# Patient Record
Sex: Male | Born: 1950 | Race: White | Hispanic: No | Marital: Married | Smoking: Never smoker
Health system: Southern US, Community
[De-identification: ages and names within clinical notes are randomized; demographics above are authoritative.]

## PROBLEM LIST (undated history)

## (undated) DIAGNOSIS — E559 Vitamin D deficiency, unspecified: Secondary | ICD-10-CM

## (undated) DIAGNOSIS — I480 Paroxysmal atrial fibrillation: Secondary | ICD-10-CM

## (undated) DIAGNOSIS — E291 Testicular hypofunction: Secondary | ICD-10-CM

## (undated) DIAGNOSIS — E039 Hypothyroidism, unspecified: Secondary | ICD-10-CM

## (undated) DIAGNOSIS — E669 Obesity, unspecified: Secondary | ICD-10-CM

## (undated) DIAGNOSIS — M7730 Calcaneal spur, unspecified foot: Secondary | ICD-10-CM

## (undated) DIAGNOSIS — G4733 Obstructive sleep apnea (adult) (pediatric): Secondary | ICD-10-CM

## (undated) DIAGNOSIS — G629 Polyneuropathy, unspecified: Secondary | ICD-10-CM

## (undated) DIAGNOSIS — E7800 Pure hypercholesterolemia, unspecified: Secondary | ICD-10-CM

## (undated) HISTORY — DX: Obstructive sleep apnea (adult) (pediatric): G47.33

## (undated) HISTORY — DX: Hypothyroidism, unspecified: E03.9

## (undated) HISTORY — DX: Obesity, unspecified: E66.9

## (undated) HISTORY — DX: Calcaneal spur, unspecified foot: M77.30

## (undated) HISTORY — PX: HEMORRHOID SURGERY: SHX153

## (undated) HISTORY — DX: Pure hypercholesterolemia, unspecified: E78.00

## (undated) HISTORY — PX: SEPTOPLASTY: SUR1290

## (undated) HISTORY — DX: Polyneuropathy, unspecified: G62.9

## (undated) HISTORY — DX: Testicular hypofunction: E29.1

## (undated) HISTORY — DX: Paroxysmal atrial fibrillation: I48.0

## (undated) HISTORY — DX: Vitamin D deficiency, unspecified: E55.9

## (undated) HISTORY — PX: TONSILLECTOMY: SUR1361

---

## 2018-09-27 NOTE — Progress Notes (Signed)
WEATHER FOR Ian Matthews

## 2018-09-28 ENCOUNTER — Ambulatory Visit (INDEPENDENT_AMBULATORY_CARE_PROVIDER_SITE_OTHER): Payer: Medicare Other | Admitting: Internal Medicine

## 2018-09-28 ENCOUNTER — Encounter: Payer: Self-pay | Admitting: Internal Medicine

## 2018-09-28 ENCOUNTER — Other Ambulatory Visit: Payer: Self-pay | Admitting: Internal Medicine

## 2018-09-28 ENCOUNTER — Encounter (INDEPENDENT_AMBULATORY_CARE_PROVIDER_SITE_OTHER): Payer: Self-pay

## 2018-09-28 VITALS — BP 110/78 | HR 54 | Ht 74.0 in | Wt 238.0 lb

## 2018-09-28 DIAGNOSIS — I480 Paroxysmal atrial fibrillation: Secondary | ICD-10-CM | POA: Diagnosis not present

## 2018-09-28 DIAGNOSIS — I4891 Unspecified atrial fibrillation: Secondary | ICD-10-CM

## 2018-09-28 DIAGNOSIS — G4733 Obstructive sleep apnea (adult) (pediatric): Secondary | ICD-10-CM

## 2018-09-28 MED ORDER — FLECAINIDE ACETATE 100 MG PO TABS: ORAL_TABLET | ORAL | 3 refills | Status: DC

## 2018-09-28 MED ORDER — METOPROLOL TARTRATE 25 MG PO TABS: 25.0000 mg | ORAL_TABLET | Freq: Four times a day (QID) | ORAL | 3 refills | Status: DC | PRN

## 2018-09-28 MED ORDER — APIXABAN 5 MG PO TABS: 5.0000 mg | ORAL_TABLET | Freq: Two times a day (BID) | ORAL | 11 refills | Status: DC

## 2018-09-28 NOTE — H&P (View-Only) (Signed)
Electrophysiology Office Note   Date:  09/28/2018   ID:  Ian Matthews, DOB [birth year], MRN [id number]  PCP:  Romeo Rabon, MD  Cardiologist:  Dr Phylliss Bob Primary Electrophysiologist: [name], MD    CC: afib   History of Present Illness: Ian Matthews is a 68 y.o. male who presents today for electrophysiology evaluation.   He is self referred for consideration of afib management. He reports having 5 episodes of afib over the past 5 years.  He has had 2 episodes over the past few months.  He is unaware of triggers or precipitants.  AF typically spontaneously terminates after about 3 days.  He has palpitations, fatigue, and dizziness with his afib.   Most recently, this past Thursday he went into afib.  He went to the ED on Sunday in [place] when his afib did not resolve.  He received IV metoprolol and converted to sinus rhythm.  He has done well since.  Today, he denies symptoms of palpitations, chest pain, shortness of breath, orthopnea, PND, lower extremity edema, claudication, dizziness, presyncope, syncope, bleeding, or neurologic sequela. The patient is tolerating medications without difficulties and is otherwise without complaint today.    Past Medical History:  Diagnosis Date  . Calcaneal spur   . Hypercholesterolemia   . Hypogonadism in male   . Hypothyroidism   . Neuropathy   . Obesity (BMI 35.0-39.9 without comorbidity)   . Obstructive sleep apnea    s/p septoplasty  . Vitamin D deficiency    Past Surgical History:  Procedure Laterality Date  . HEMORRHOID SURGERY    . SEPTOPLASTY    . TONSILLECTOMY       Current Outpatient Medications  Medication Sig Dispense Refill  . doxycycline (ORACEA) 40 MG capsule Take 40 mg by mouth every morning.    Marland Kitchen doxycycline (VIBRAMYCIN) 100 MG capsule Take 1 capsule by mouth as needed for rash.    . ergocalciferol (VITAMIN D2) 1.25 MG (50000 UT) capsule Take 50,000 Units by mouth once a week.    . levothyroxine  (SYNTHROID, LEVOTHROID) 100 MCG tablet Take 100 mcg by mouth daily before breakfast.    . metoprolol succinate (TOPROL-XL) 25 MG 24 hr tablet Take 1 tablet by mouth daily.    . rosuvastatin (CRESTOR) 5 MG tablet Take 5 mg by mouth every Monday, Wednesday, and Friday.     No current facility-administered medications for this visit.     Allergies:   Patient has no known allergies.   Social History:  The patient  reports that he has never smoked. He has never used smokeless tobacco. He reports previous alcohol use. He reports previous drug use.   Family History:  The patient's  family history includes Alzheimer's disease in his mother; Atrial fibrillation in his mother and sister; Cancer in his father; Diabetes in his father.    ROS:  Please see the history of present illness.   All other systems are personally reviewed and negative.    PHYSICAL EXAM: VS:  BP 110/78   Pulse (!) 54   Ht 6\' 2"  (1.88 m)   Wt 238 lb (108 kg)   SpO2 96%   BMI 30.56 kg/m  , BMI Body mass index is 30.56 kg/m. GEN: Well nourished, well developed, in no acute distress  HEENT: normal  Neck: no JVD, carotid bruits, or masses Cardiac: RRR; no murmurs, rubs, or gallops,no edema  Respiratory:  clear to auscultation bilaterally, normal work of breathing GI: soft, nontender, nondistended, + BS  MS: no deformity or atrophy  Skin: warm and dry  Neuro:  Strength and sensation are intact Psych: euthymic mood, full affect  EKG:  EKG is ordered today. The ekg ordered today is personally reviewed and shows sinus bradycardia   Lipid Panel  No results found for: CHOL, TRIG, HDL, CHOLHDL, VLDL, LDLCALC, LDLDIRECT personally reviewed   Wt Readings from Last 3 Encounters:  09/28/18 238 lb (108 kg)      Other studies personally reviewed: Additional studies/ records that were reviewed today include: records from Dr Zakhary's office  Review of the above records today demonstrates: prior echo reveals no structural  heart disease,  afib is documented on ekg   ASSESSMENT AND PLAN:  1.  afib The patient has symptomatic, recurrent paroyxsmal atrial fibrillation. he has failed medical therapy with metoprolol.  Given bradycardia, his AAD options are limited Chads2vasc score is 1.  Therapeutic strategies for afib including medicine (pill in pocket flecainide) and ablation were discussed in detail with the patient today. Risk, benefits, and alternatives to EP study and radiofrequency ablation for afib were also discussed in detail today. These risks include but are not limited to stroke, bleeding, vascular damage, tamponade, perforation, damage to the esophagus, lungs, and other structures, pulmonary vein stenosis, worsening renal function, and death. The patient understands these risk and wishes to proceed.  We will therefore proceed with catheter ablation at the next available time.  Carto, ICE, anesthesia are requested for the procedure.  Will also obtain cardiac CT prior to the procedure to exclude LAA thrombus and further evaluate atrial anatomy.  2. Overweight Body mass index is 30.56 kg/m. Lifestyle modification encouraged  3. OSA He thinks this is resolved s/p septoplasty I have encouraged that he have repeat sleep study with primary care   Current medicines are reviewed at length with the patient today.   The patient does not have concerns regarding his medicines.  The following changes were made today:  none  Labs/ tests ordered today include:  Orders Placed This Encounter  Procedures  . EKG 12-Lead     Signed, Gary Bultman, MD  09/28/2018 10:21 AM     [facility] [address] [place] [address] [phone] (office) [phone] (fax) 

## 2018-09-28 NOTE — Progress Notes (Signed)
Electrophysiology Office Note   Date:  09/28/2018   ID:  Ian Matthews, DOB [birth year], MRN [id number]  PCP:  Romeo Rabon, MD  Cardiologist:  Dr Phylliss Bob Primary Electrophysiologist: [name], MD    CC: afib   History of Present Illness: Ian Matthews is a 68 y.o. male who presents today for electrophysiology evaluation.   He is self referred for consideration of afib management. He reports having 5 episodes of afib over the past 5 years.  He has had 2 episodes over the past few months.  He is unaware of triggers or precipitants.  AF typically spontaneously terminates after about 3 days.  He has palpitations, fatigue, and dizziness with his afib.   Most recently, this past Thursday he went into afib.  He went to the ED on Sunday in [place] when his afib did not resolve.  He received IV metoprolol and converted to sinus rhythm.  He has done well since.  Today, he denies symptoms of palpitations, chest pain, shortness of breath, orthopnea, PND, lower extremity edema, claudication, dizziness, presyncope, syncope, bleeding, or neurologic sequela. The patient is tolerating medications without difficulties and is otherwise without complaint today.    Past Medical History:  Diagnosis Date  . Calcaneal spur   . Hypercholesterolemia   . Hypogonadism in male   . Hypothyroidism   . Neuropathy   . Obesity (BMI 35.0-39.9 without comorbidity)   . Obstructive sleep apnea    s/p septoplasty  . Vitamin D deficiency    Past Surgical History:  Procedure Laterality Date  . HEMORRHOID SURGERY    . SEPTOPLASTY    . TONSILLECTOMY       Current Outpatient Medications  Medication Sig Dispense Refill  . doxycycline (ORACEA) 40 MG capsule Take 40 mg by mouth every morning.    Marland Kitchen doxycycline (VIBRAMYCIN) 100 MG capsule Take 1 capsule by mouth as needed for rash.    . ergocalciferol (VITAMIN D2) 1.25 MG (50000 UT) capsule Take 50,000 Units by mouth once a week.    . levothyroxine  (SYNTHROID, LEVOTHROID) 100 MCG tablet Take 100 mcg by mouth daily before breakfast.    . metoprolol succinate (TOPROL-XL) 25 MG 24 hr tablet Take 1 tablet by mouth daily.    . rosuvastatin (CRESTOR) 5 MG tablet Take 5 mg by mouth every Monday, Wednesday, and Friday.     No current facility-administered medications for this visit.     Allergies:   Patient has no known allergies.   Social History:  The patient  reports that he has never smoked. He has never used smokeless tobacco. He reports previous alcohol use. He reports previous drug use.   Family History:  The patient's  family history includes Alzheimer's disease in his mother; Atrial fibrillation in his mother and sister; Cancer in his father; Diabetes in his father.    ROS:  Please see the history of present illness.   All other systems are personally reviewed and negative.    PHYSICAL EXAM: VS:  BP 110/78   Pulse (!) 54   Ht 6\' 2"  (1.88 m)   Wt 238 lb (108 kg)   SpO2 96%   BMI 30.56 kg/m  , BMI Body mass index is 30.56 kg/m. GEN: Well nourished, well developed, in no acute distress  HEENT: normal  Neck: no JVD, carotid bruits, or masses Cardiac: RRR; no murmurs, rubs, or gallops,no edema  Respiratory:  clear to auscultation bilaterally, normal work of breathing GI: soft, nontender, nondistended, + BS  MS: no deformity or atrophy  Skin: warm and dry  Neuro:  Strength and sensation are intact Psych: euthymic mood, full affect  EKG:  EKG is ordered today. The ekg ordered today is personally reviewed and shows sinus bradycardia   Lipid Panel  No results found for: CHOL, TRIG, HDL, CHOLHDL, VLDL, LDLCALC, LDLDIRECT personally reviewed   Wt Readings from Last 3 Encounters:  09/28/18 238 lb (108 kg)      Other studies personally reviewed: Additional studies/ records that were reviewed today include: records from Dr Oneita Jolly office  Review of the above records today demonstrates: prior echo reveals no structural  heart disease,  afib is documented on ekg   ASSESSMENT AND PLAN:  1.  afib The patient has symptomatic, recurrent paroyxsmal atrial fibrillation. he has failed medical therapy with metoprolol.  Given bradycardia, his AAD options are limited Chads2vasc score is 1.  Therapeutic strategies for afib including medicine (pill in pocket flecainide) and ablation were discussed in detail with the patient today. Risk, benefits, and alternatives to EP study and radiofrequency ablation for afib were also discussed in detail today. These risks include but are not limited to stroke, bleeding, vascular damage, tamponade, perforation, damage to the esophagus, lungs, and other structures, pulmonary vein stenosis, worsening renal function, and death. The patient understands these risk and wishes to proceed.  We will therefore proceed with catheter ablation at the next available time.  Carto, ICE, anesthesia are requested for the procedure.  Will also obtain cardiac CT prior to the procedure to exclude LAA thrombus and further evaluate atrial anatomy.  2. Overweight Body mass index is 30.56 kg/m. Lifestyle modification encouraged  3. OSA He thinks this is resolved s/p septoplasty I have encouraged that he have repeat sleep study with primary care   Current medicines are reviewed at length with the patient today.   The patient does not have concerns regarding his medicines.  The following changes were made today:  none  Labs/ tests ordered today include:  Orders Placed This Encounter  Procedures  . EKG 12-Lead     Signed, [name], MD  09/28/2018 10:21 AM     [facility] [address] [place] [address] [phone] (office) [phone] (fax)

## 2018-09-28 NOTE — Patient Instructions (Addendum)
Medication Instructions:  Your physician has recommended you make the following change in your medication:   1.  Start taking Eliquis 5 mg--Take one tablet by mouth twice a day.  2.  You are getting a prescription for Flecainide 100 mg tablets- Take 3 tablets daily by mouth as needed for breakthrough afib.  3.  You are getting a prescription for Metoprolol tartrate 25 mg-  Take 1-2 tablets by mouth every 6 hours as needed for breakthrough afib.   Labwork: You will not need lab work.  Your lab work has been scanned into [employer].  Testing/Procedures: Your physician has requested that you have cardiac CT. Cardiac computed tomography (CT) is a painless test that uses an x-ray machine to take clear, detailed pictures of your heart. For further information please visit [email or web address] Please follow instruction sheet as given.   Your physician has recommended that you have an ablation. Catheter ablation is a medical procedure used to treat some cardiac arrhythmias (irregular heartbeats). During catheter ablation, a long, thin, flexible tube is put into a blood vessel in your groin (upper thigh), or neck. This tube is called an ablation catheter. It is then guided to your heart through the blood vessel. Radio frequency waves destroy small areas of heart tissue where abnormal heartbeats may cause an arrhythmia to start. Please see the instruction sheet given to you today.   Follow-Up: You will follow up with Rudi Coco, NP with the Atrial fibrillation (Afib) clinic 4 weeks after your ablation.  You will follow up with Dr. Johney Frame 3 months after your procedure.    CARDIAC CT INSTRUCTIONS:  Please arrive at the [facility] main entrance of [facility] at 30-45 minutes prior to test start time  [facility] [address] [place], [place] [address] [phone]  Proceed to the [facility] Radiology Department (First Floor).  Please follow these instructions carefully  (unless otherwise directed):  Hold all erectile dysfunction medications at least 48 hours prior to test.  On the Night Before the Test: . Be sure to Drink plenty of water. . Do not consume any caffeinated/decaffeinated beverages or chocolate 12 hours prior to your test. . Do not take any antihistamines 12 hours prior to your test.  On the Day of the Test: . Drink plenty of water. Do not drink any water within one hour of the test. . Do not eat any food 4 hours prior to the test. . You may take your regular medications prior to the test.         -Take metoprolol (Lopressor) 2 hours prior to test (if applicable).                  -If HR is less than 55 BPM- No Beta Blocker                -IF HR is greater than 55 BPM and patient is less than or equal to 20 yrs old Lopressor 100mg  x1.                   After the Test: . Drink plenty of water. . After receiving IV contrast, you may experience a mild flushed feeling. This is normal. . On occasion, you may experience a mild rash up to 24 hours after the test. This is not dangerous. If this occurs, you can take Benadryl 25 mg and increase your fluid intake. . If you experience trouble breathing, this can be serious. If it is  severe call 911 IMMEDIATELY. If it is mild, please call our office.    ABLATION INSTRUCTIONS:  Please arrive to ADMITTING down the hall [facility] of [facility] at:  5:30 am on October 17, 2018  Do not eat or drink after midnight prior to procedure On the morning of your procedure do not take any medications. Plan for one night stay.   You will need someone to drive you home at discharge.     Cardiac Ablation Cardiac ablation is a procedure to disable (ablate) a small amount of heart tissue in very specific places. The heart has many electrical connections. Sometimes these connections are abnormal and can cause the heart to beat very fast or irregularly. Ablating some of the problem  areas can improve the heart rhythm or return it to normal. Ablation may be done for people who:  Have Wolff-Parkinson-White syndrome.  Have fast heart rhythms (tachycardia).  Have taken medicines for an abnormal heart rhythm (arrhythmia) that were not effective or caused side effects.  Have a high-risk heartbeat that may be life-threatening. During the procedure, a small incision is made in the neck or the groin, and a long, thin, flexible tube (catheter) is inserted into the incision and moved to the heart. Small devices (electrodes) on the tip of the catheter will send out electrical currents. A type of X-ray (fluoroscopy) will be used to help guide the catheter and to provide images of the heart. Tell a health care provider about:  Any allergies you have.  All medicines you are taking, including vitamins, herbs, eye drops, creams, and over-the-counter medicines.  Any problems you or family members have had with anesthetic medicines.  Any blood disorders you have.  Any surgeries you have had.  Any medical conditions you have, such as kidney failure.  Whether you are pregnant or may be pregnant. What are the risks? Generally, this is a safe procedure. However, problems may occur, including:  Infection.  Bruising and bleeding at the catheter insertion site.  Bleeding into the chest, especially into the sac that surrounds the heart. This is a serious complication.  Stroke or blood clots.  Damage to other structures or organs.  Allergic reaction to medicines or dyes.  Need for a permanent pacemaker if the normal electrical system is damaged. A pacemaker is a small computer that sends electrical signals to the heart and helps your heart beat normally.  The procedure not being fully effective. This may not be recognized until months later. Repeat ablation procedures are sometimes required. What happens before the procedure?  Follow instructions from your health care provider  about eating or drinking restrictions.  Ask your health care provider about: ? Changing or stopping your regular medicines. This is especially important if you are taking diabetes medicines or blood thinners. ? Taking medicines such as aspirin and ibuprofen. These medicines can thin your blood. Do not take these medicines before your procedure if your health care provider instructs you not to.  Plan to have someone take you home from the hospital or clinic.  If you will be going home right after the procedure, plan to have someone with you for 24 hours. What happens during the procedure?  To lower your risk of infection: ? Your health care team will wash or sanitize their hands. ? Your skin will be washed with soap. ? Hair may be removed from the incision area.  An IV tube will be inserted into one of your  veins.  You will be given a medicine to help you relax (sedative).  The skin on your neck or groin will be numbed.  An incision will be made in your neck or your groin.  A needle will be inserted through the incision and into a large vein in your neck or groin.  A catheter will be inserted into the needle and moved to your heart.  Dye may be injected through the catheter to help your surgeon see the area of the heart that needs treatment.  Electrical currents will be sent from the catheter to ablate heart tissue in desired areas. There are three types of energy that may be used to ablate heart tissue: ? Heat (radiofrequency energy). ? Laser energy. ? Extreme cold (cryoablation).  When the necessary tissue has been ablated, the catheter will be removed.  Pressure will be held on the catheter insertion area to prevent excessive bleeding.  A bandage (dressing) will be placed over the catheter insertion area. The procedure may vary among health care providers and hospitals. What happens after the procedure?  Your blood pressure, heart rate, breathing rate, and blood oxygen  level will be monitored until the medicines you were given have worn off.  Your catheter insertion area will be monitored for bleeding. You will need to lie still for a few hours to ensure that you do not bleed from the catheter insertion area.  Do not drive for 24 hours or as long as directed by your health care provider. Summary  Cardiac ablation is a procedure to disable (ablate) a small amount of heart tissue in very specific places. Ablating some of the problem areas can improve the heart rhythm or return it to normal.  During the procedure, electrical currents will be sent from the catheter to ablate heart tissue in desired areas. This information is not intended to replace advice given to you by your health care provider. Make sure you discuss any questions you have with your health care provider. Document Released: 12/12/2008 Document Revised: 06/14/2016 Document Reviewed: 06/14/2016 Elsevier Interactive Patient Education  2019 [employer].

## 2018-10-09 ENCOUNTER — Telehealth (HOSPITAL_COMMUNITY): Payer: Self-pay | Admitting: Emergency Medicine

## 2018-10-09 NOTE — Telephone Encounter (Signed)
Reaching out to patient to offer assistance regarding upcoming cardiac imaging study; pt verbalizes understanding of appt date/time, parking situation and where to check in, pre-test NPO status and medications ordered, and verified current allergies; name and call back number provided for further questions should they arise Demorio Seeley RN Navigator Cardiac Imaging [facility] [phone] office [phone] cell 

## 2018-10-10 ENCOUNTER — Encounter (HOSPITAL_COMMUNITY): Payer: Self-pay

## 2018-10-10 ENCOUNTER — Ambulatory Visit (HOSPITAL_COMMUNITY): Payer: Medicare Other

## 2018-10-10 ENCOUNTER — Ambulatory Visit (HOSPITAL_COMMUNITY)
Admission: RE | Admit: 2018-10-10 | Discharge: 2018-10-10 | Disposition: A | Discharge status: Home / Self Care | Payer: Medicare Other | Source: Ambulatory Visit | Attending: Internal Medicine | Admitting: Internal Medicine

## 2018-10-10 DIAGNOSIS — I4891 Unspecified atrial fibrillation: Secondary | ICD-10-CM | POA: Diagnosis not present

## 2018-10-10 MED ORDER — IOHEXOL 350 MG/ML SOLN
80.0000 mL | Freq: Once | INTRAVENOUS | Status: AC | PRN
  Administered 2018-10-10: 80 mL via INTRAVENOUS

## 2018-10-10 NOTE — Progress Notes (Signed)
Pt tolerated exam without incident.  PIV removed, was bleeding, applied manual pressure x3 min until bleeding stooped and then applied gauze dressing.  Provided instructions to patient on what to do if the puncture site were to bleed again, pt stated he understood.  Pt discharged

## 2018-10-10 NOTE — Discharge Instructions (Signed)
What You Need to Know About IV Contrast Material °IV contrast material is most often a fluid that is used with some imaging tests. Contrast material is injected into your body through a vein to help your health care providers see your organs and tissues more clearly. It may be used with: °· X-ray. °· MRI. °· CT. °· Ultrasound. °Contrast material is used when your health care providers need a detailed look at organs, tissues, or blood vessels that may not show up with the standard test. IV contrast may be used for imaging tests that examine: °· Muscles, skin, and fat. °· Breasts. °· Brain. °· Digestive tract. °· Heart. °· Liver. °· Lungs and many other internal organs. °What are the risks of using IV contrast material? °The risks of using IV contrast material include: °· Headache. °· Itching, skin rash, and hives. °· Allergic reactions. °· Nausea and vomiting. °· Wheezing or difficulty breathing. °· Abnormal heart rate. °· Blood pressure changes. °· Throat swelling. °· Kidney damage. °These complications are more likely to occur in people who: °· Have kidney failure. °· Have liver problems. °· Have certain heart problems, including: °? Heart failure. °? Heart attack. °? Heart infection. °? Heart valve problems. °· Abuse alcohol. °· Have allergies or asthma. °· Are dehydrated. °· Have sickle cell anemia or similar problems. °· Have had trouble with IV contrast material in the past. °· Take certain medicines, such as: °? Metformin. °? NSAIDs. °? Beta blockers. °? Interleukin-2. °How do I prepare for my test with IV contrast material? °· Follow instructions from your health care provider about eating or drinking restrictions. °· Ask your health care provider about changing or stopping your regular medicines. This is especially important if you are taking diabetes medicines or blood thinners. °· Tell your health care provider about: °? Any previous illnesses, surgeries, or pre-existing medical conditions. °? Whether you  are pregnant or may be pregnant. °? Whether you are breastfeeding. Most contrast agents are safe for use in breastfeeding women. °· You may have a physical exam to determine any potential risks. °· Ask if you will be given a medicine (sedative) to help you relax during the procedure. If so, plan to have someone take you home after test. °What happens during the test with IV contrast material? ° °· You may be given a sedative to help you relax. °· A needle will be inserted into one of your veins to administer the IV contrast material. °· You may feel warmth or flushing as the material enters your bloodstream. °· You may have a metallic taste in your mouth for a few minutes. °· The needle may cause some discomfort and bruising. °· After the contrast material is in your body, the imaging test will be done. °The procedure may vary among health care providers and hospitals. °What happens after the test with IV contrast material? °· You may be asked to drink water or other fluids to wash (flush) the contrast material out of your body. °· Drink enough fluid to keep your urine pale yellow. °· Do not drive for 24 hours if you received a sedative. °· It is your responsibility to get your test results. Ask your health care provider or the department performing the test when your results will be ready. °Contact a health care provider if: °· You have redness, swelling, or pain near your IV site. °Get help right away if: °· You have an abnormal heart rhythm. °· You have trouble breathing. °· You have: °?   Chest pain. °? Pain in your back, neck, arm, jaw, or stomach. °? Nausea or sweating. °? Hives or a rash. °· You start shaking and cannot stop. °These symptoms may represent a serious problem that is an emergency. Do not wait to see if the symptoms will go away. Get medical help right away. Call your local emergency services (911 in the [place] Do not drive yourself to the hospital. °Summary °· IV contrast may be used for imaging  tests to help your health care providers see your organs and tissues more clearly. °· Tell your health care provider if you are pregnant or may be pregnant. °· During the procedure, you may feel warmth or flushing as the material enters your bloodstream. °· After the procedure, drink enough fluid to keep your urine pale yellow. °This information is not intended to replace advice given to you by your health care provider. Make sure you discuss any questions you have with your health care provider. °Document Released: 07/14/2009 Document Revised: 03/20/2018 Document Reviewed: 04/02/2015 °Elsevier Interactive Patient Education © 2019 [employer] ° °

## 2018-10-16 NOTE — Anesthesia Preprocedure Evaluation (Addendum)
Anesthesia Evaluation  Patient identified by MRN, date of birth, ID band Patient awake    Reviewed: Allergy & Precautions, NPO status , Patient's Chart, lab work & pertinent test results  Airway Mallampati: II  TM Distance: >3 FB Neck ROM: Full    Dental  (+) Teeth Intact, Dental Advisory Given, Chipped,    Pulmonary sleep apnea ,    Pulmonary exam normal breath sounds clear to auscultation       Cardiovascular Normal cardiovascular exam+ dysrhythmias Atrial Fibrillation  Rhythm:Regular Rate:Normal     Neuro/Psych negative neurological ROS     GI/Hepatic negative GI ROS, Neg liver ROS,   Endo/Other  Hypothyroidism Obesity   Renal/GU negative Renal ROS     Musculoskeletal negative musculoskeletal ROS (+)   Abdominal   Peds  Hematology  (+) Blood dyscrasia (Eliquis), ,   Anesthesia Other Findings Day of surgery medications reviewed with the patient.  Reproductive/Obstetrics                            Anesthesia Physical Anesthesia Plan  ASA: III  Anesthesia Plan: General   Post-op Pain Management:    Induction: Intravenous  PONV Risk Score and Plan: 2 and Ondansetron, Dexamethasone and Midazolam  Airway Management Planned: Oral ETT  Additional Equipment:   Intra-op Plan:   Post-operative Plan: Extubation in OR  Informed Consent: I have reviewed the patients History and Physical, chart, labs and discussed the procedure including the risks, benefits and alternatives for the proposed anesthesia with the patient or authorized representative who has indicated his/her understanding and acceptance.     Dental advisory given  Plan Discussed with: CRNA  Anesthesia Plan Comments:        Anesthesia Quick Evaluation

## 2018-10-17 ENCOUNTER — Ambulatory Visit (HOSPITAL_COMMUNITY): Payer: Medicare Other | Admitting: Anesthesiology

## 2018-10-17 ENCOUNTER — Other Ambulatory Visit: Payer: Self-pay

## 2018-10-17 ENCOUNTER — Encounter (HOSPITAL_COMMUNITY): Payer: Self-pay | Admitting: Internal Medicine

## 2018-10-17 ENCOUNTER — Encounter (HOSPITAL_COMMUNITY)
Admission: RE | Disposition: A | Discharge status: Home / Self Care | Payer: Self-pay | Source: Home / Self Care | Attending: Internal Medicine

## 2018-10-17 ENCOUNTER — Ambulatory Visit (HOSPITAL_COMMUNITY)
Admission: RE | Admit: 2018-10-17 | Discharge: 2018-10-18 | Disposition: A | Discharge status: Home / Self Care | Payer: Medicare Other | Attending: Internal Medicine | Admitting: Internal Medicine

## 2018-10-17 DIAGNOSIS — Z79899 Other long term (current) drug therapy: Secondary | ICD-10-CM | POA: Insufficient documentation

## 2018-10-17 DIAGNOSIS — E669 Obesity, unspecified: Secondary | ICD-10-CM | POA: Diagnosis not present

## 2018-10-17 DIAGNOSIS — Z7901 Long term (current) use of anticoagulants: Secondary | ICD-10-CM | POA: Diagnosis not present

## 2018-10-17 DIAGNOSIS — Z8249 Family history of ischemic heart disease and other diseases of the circulatory system: Secondary | ICD-10-CM | POA: Diagnosis not present

## 2018-10-17 DIAGNOSIS — Z6830 Body mass index (BMI) 30.0-30.9, adult: Secondary | ICD-10-CM | POA: Insufficient documentation

## 2018-10-17 DIAGNOSIS — Z79890 Hormone replacement therapy (postmenopausal): Secondary | ICD-10-CM | POA: Diagnosis not present

## 2018-10-17 DIAGNOSIS — E559 Vitamin D deficiency, unspecified: Secondary | ICD-10-CM | POA: Diagnosis not present

## 2018-10-17 DIAGNOSIS — G629 Polyneuropathy, unspecified: Secondary | ICD-10-CM | POA: Insufficient documentation

## 2018-10-17 DIAGNOSIS — E039 Hypothyroidism, unspecified: Secondary | ICD-10-CM | POA: Insufficient documentation

## 2018-10-17 DIAGNOSIS — E785 Hyperlipidemia, unspecified: Secondary | ICD-10-CM | POA: Insufficient documentation

## 2018-10-17 DIAGNOSIS — G4733 Obstructive sleep apnea (adult) (pediatric): Secondary | ICD-10-CM | POA: Diagnosis not present

## 2018-10-17 DIAGNOSIS — I480 Paroxysmal atrial fibrillation: Secondary | ICD-10-CM | POA: Diagnosis not present

## 2018-10-17 HISTORY — PX: ATRIAL FIBRILLATION ABLATION: EP1191

## 2018-10-17 LAB — POCT ACTIVATED CLOTTING TIME
Activated Clotting Time: 257 seconds
Activated Clotting Time: 301 seconds
Activated Clotting Time: 230 seconds
Activated Clotting Time: 197 seconds

## 2018-10-17 SURGERY — ATRIAL FIBRILLATION ABLATION
Anesthesia: General

## 2018-10-17 MED ORDER — APIXABAN 5 MG PO TABS
5.0000 mg | ORAL_TABLET | Freq: Two times a day (BID) | ORAL | Status: DC
  Administered 2018-10-17 – 2018-10-18 (×3): 5 mg via ORAL
  Filled 2018-10-17 (×2): qty 1

## 2018-10-17 MED ORDER — SODIUM CHLORIDE 0.9 % IV SOLN: 250.0000 mL | INTRAVENOUS | Status: DC | PRN

## 2018-10-17 MED ORDER — SODIUM CHLORIDE 0.9 % IV SOLN
INTRAVENOUS | Status: DC | PRN
  Administered 2018-10-17: 50 ug/min via INTRAVENOUS

## 2018-10-17 MED ORDER — OFF THE BEAT BOOK
Freq: Once | Status: AC
  Administered 2018-10-17: 1

## 2018-10-17 MED ORDER — HEPARIN SODIUM (PORCINE) 1000 UNIT/ML IJ SOLN
INTRAMUSCULAR | Status: AC
  Filled 2018-10-17: qty 1

## 2018-10-17 MED ORDER — ACETAMINOPHEN 325 MG PO TABS: 650.0000 mg | ORAL_TABLET | ORAL | Status: DC | PRN

## 2018-10-17 MED ORDER — PROPOFOL 10 MG/ML IV BOLUS
INTRAVENOUS | Status: DC | PRN
  Administered 2018-10-17: 150 mg via INTRAVENOUS

## 2018-10-17 MED ORDER — SODIUM CHLORIDE 0.9% FLUSH
3.0000 mL | Freq: Two times a day (BID) | INTRAVENOUS | Status: DC
  Administered 2018-10-17 – 2018-10-18 (×2): 3 mL via INTRAVENOUS

## 2018-10-17 MED ORDER — GLYCOPYRROLATE PF 0.2 MG/ML IJ SOSY
PREFILLED_SYRINGE | INTRAMUSCULAR | Status: DC | PRN
  Administered 2018-10-17: .2 mg via INTRAVENOUS

## 2018-10-17 MED ORDER — ROCURONIUM BROMIDE 50 MG/5ML IV SOSY
PREFILLED_SYRINGE | INTRAVENOUS | Status: DC | PRN
  Administered 2018-10-17: 10 mg via INTRAVENOUS
  Administered 2018-10-17: 60 mg via INTRAVENOUS

## 2018-10-17 MED ORDER — ISOPROTERENOL HCL 0.2 MG/ML IJ SOLN
INTRAMUSCULAR | Status: AC
  Filled 2018-10-17: qty 5

## 2018-10-17 MED ORDER — MIDAZOLAM HCL 5 MG/5ML IJ SOLN
INTRAMUSCULAR | Status: DC | PRN
  Administered 2018-10-17 (×2): 1 mg via INTRAVENOUS

## 2018-10-17 MED ORDER — BUPIVACAINE HCL (PF) 0.25 % IJ SOLN
INTRAMUSCULAR | Status: DC | PRN
  Administered 2018-10-17: 30 mL

## 2018-10-17 MED ORDER — SODIUM CHLORIDE 0.9 % IV SOLN
INTRAVENOUS | Status: DC
  Administered 2018-10-17: 06:00:00 via INTRAVENOUS

## 2018-10-17 MED ORDER — HEPARIN SODIUM (PORCINE) 1000 UNIT/ML IJ SOLN
INTRAMUSCULAR | Status: DC | PRN
  Administered 2018-10-17: 5000 [IU] via INTRAVENOUS
  Administered 2018-10-17: 4000 [IU] via INTRAVENOUS

## 2018-10-17 MED ORDER — METOPROLOL SUCCINATE ER 25 MG PO TB24
25.0000 mg | ORAL_TABLET | Freq: Every day | ORAL | Status: DC
  Administered 2018-10-17: 25 mg via ORAL
  Filled 2018-10-17: qty 1

## 2018-10-17 MED ORDER — HEPARIN SODIUM (PORCINE) 1000 UNIT/ML IJ SOLN
INTRAMUSCULAR | Status: DC | PRN
  Administered 2018-10-17: 1000 [IU] via INTRAVENOUS
  Administered 2018-10-17: 12000 [IU] via INTRAVENOUS

## 2018-10-17 MED ORDER — ONDANSETRON HCL 4 MG/2ML IJ SOLN: 4.0000 mg | Freq: Four times a day (QID) | INTRAMUSCULAR | Status: DC | PRN

## 2018-10-17 MED ORDER — PROTAMINE SULFATE 10 MG/ML IV SOLN
INTRAVENOUS | Status: DC | PRN
  Administered 2018-10-17: 20 mg via INTRAVENOUS
  Administered 2018-10-17 (×2): 10 mg via INTRAVENOUS

## 2018-10-17 MED ORDER — SODIUM CHLORIDE 0.9% FLUSH: 3.0000 mL | INTRAVENOUS | Status: DC | PRN

## 2018-10-17 MED ORDER — DEXAMETHASONE SODIUM PHOSPHATE 10 MG/ML IJ SOLN
INTRAMUSCULAR | Status: DC | PRN
  Administered 2018-10-17: 10 mg via INTRAVENOUS

## 2018-10-17 MED ORDER — HEPARIN (PORCINE) IN NACL 1000-0.9 UT/500ML-% IV SOLN
INTRAVENOUS | Status: DC | PRN
  Administered 2018-10-17: 500 mL

## 2018-10-17 MED ORDER — ONDANSETRON HCL 4 MG/2ML IJ SOLN
INTRAMUSCULAR | Status: DC | PRN
  Administered 2018-10-17: 4 mg via INTRAVENOUS

## 2018-10-17 MED ORDER — FENTANYL CITRATE (PF) 100 MCG/2ML IJ SOLN
INTRAMUSCULAR | Status: DC | PRN
  Administered 2018-10-17: 50 ug via INTRAVENOUS
  Administered 2018-10-17 (×2): 25 ug via INTRAVENOUS

## 2018-10-17 MED ORDER — EPHEDRINE SULFATE 50 MG/ML IJ SOLN
INTRAMUSCULAR | Status: DC | PRN
  Administered 2018-10-17: 10 mg via INTRAVENOUS

## 2018-10-17 MED ORDER — LEVOTHYROXINE SODIUM 100 MCG PO TABS
100.0000 ug | ORAL_TABLET | Freq: Every day | ORAL | Status: DC
  Administered 2018-10-17: 100 ug via ORAL
  Filled 2018-10-17: qty 1

## 2018-10-17 MED ORDER — ACETAMINOPHEN 500 MG PO TABS
1000.0000 mg | ORAL_TABLET | Freq: Once | ORAL | Status: AC
  Administered 2018-10-17: 1000 mg via ORAL
  Filled 2018-10-17: qty 2

## 2018-10-17 MED ORDER — LIDOCAINE 2% (20 MG/ML) 5 ML SYRINGE
INTRAMUSCULAR | Status: DC | PRN
  Administered 2018-10-17: 60 mg via INTRAVENOUS

## 2018-10-17 MED ORDER — HEPARIN (PORCINE) IN NACL 1000-0.9 UT/500ML-% IV SOLN
INTRAVENOUS | Status: AC
  Filled 2018-10-17: qty 500

## 2018-10-17 MED ORDER — BUPIVACAINE HCL (PF) 0.25 % IJ SOLN
INTRAMUSCULAR | Status: AC
  Filled 2018-10-17: qty 30

## 2018-10-17 MED ORDER — HYDROCODONE-ACETAMINOPHEN 5-325 MG PO TABS: 1.0000 | ORAL_TABLET | ORAL | Status: DC | PRN

## 2018-10-17 MED ORDER — LACTATED RINGERS IV SOLN
INTRAVENOUS | Status: DC | PRN
  Administered 2018-10-17: 07:00:00 via INTRAVENOUS

## 2018-10-17 MED ORDER — ISOPROTERENOL HCL 0.2 MG/ML IJ SOLN
INTRAVENOUS | Status: DC | PRN
  Administered 2018-10-17: 20 ug/min via INTRAVENOUS

## 2018-10-17 SURGICAL SUPPLY — 17 items
BLANKET WARM UNDERBOD FULL ACC (MISCELLANEOUS) ×3 IMPLANT
CATH MAPPNG PENTARAY F 2-6-2MM (CATHETERS) ×1 IMPLANT
CATH NAVISTAR SMARTTOUCH DF (ABLATOR) ×3 IMPLANT
CATH SOUNDSTAR ECO REPROCESSED (CATHETERS) ×3 IMPLANT
CATH WEBSTER BI DIR CS D-F CRV (CATHETERS) ×3 IMPLANT
COVER SWIFTLINK CONNECTOR (BAG) ×3 IMPLANT
NEEDLE BAYLIS TRANSSEPTAL 71CM (NEEDLE) ×3 IMPLANT
PACK EP LATEX FREE (CUSTOM PROCEDURE TRAY) ×2
PACK EP LF (CUSTOM PROCEDURE TRAY) ×1 IMPLANT
PAD PRO RADIOLUCENT 2001M-C (PAD) ×3 IMPLANT
PATCH CARTO3 (PAD) ×3 IMPLANT
PENTARAY F 2-6-2MM (CATHETERS) ×3
SHEATH AVANTI 11F 11CM (SHEATH) ×3 IMPLANT
SHEATH PINNACLE 7F 10CM (SHEATH) ×6 IMPLANT
SHEATH PINNACLE 9F 10CM (SHEATH) ×3 IMPLANT
SHEATH SWARTZ TS SL2 63CM 8.5F (SHEATH) ×3 IMPLANT
TUBING SMART ABLATE COOLFLOW (TUBING) ×3 IMPLANT

## 2018-10-17 NOTE — Progress Notes (Signed)
Site area: RFV x 3 Site Prior to Removal:  Level 0 Pressure Applied For: 30 min Manual:   yes Patient Status During Pull:  stable Post Pull Site:  Level 0 Post Pull Instructions Given:  palpable Post Pull Pulses Present:  Dressing Applied:  clear Bedrest begins @ 1300 till 1900 Comments:

## 2018-10-17 NOTE — Anesthesia Procedure Notes (Signed)
Procedure Name: Intubation Date/Time: 10/17/2018 7:47 AM Performed by: Lavell Luster, CRNA Pre-anesthesia Checklist: Patient identified, Emergency Drugs available, Suction available, Patient being monitored and Timeout performed Patient Re-evaluated:Patient Re-evaluated prior to induction Oxygen Delivery Method: Circle system utilized Preoxygenation: Pre-oxygenation with 100% oxygen Induction Type: IV induction Ventilation: Mask ventilation without difficulty Laryngoscope Size: Mac and 4 Grade View: Grade I Tube type: Oral Tube size: 7.5 mm Number of attempts: 1 Airway Equipment and Method: Stylet Placement Confirmation: ETT inserted through vocal cords under direct vision,  positive ETCO2 and breath sounds checked- equal and bilateral Secured at: 22 cm Tube secured with: Tape Dental Injury: Teeth and Oropharynx as per pre-operative assessment

## 2018-10-17 NOTE — Discharge Summary (Addendum)
ELECTROPHYSIOLOGY PROCEDURE DISCHARGE SUMMARY    Patient ID: Ian Matthews,  MRN: [id number], DOB/AGE: 69-01-1951 69 y.o.  Admit date: 10/17/2018 Discharge date: 10/18/2018  Primary Care Physician: Romeo Rabon, MD  Primary Cardiologist: Dr. Phylliss Bob Electrophysiologist: [name], MD  Primary Discharge Diagnosis:  1. Paroxysmal AFib     CHA2DS2Vasc is one, on Eliquis  Secondary Discharge Diagnosis:  1. HLD 2. Hypothyroidism 3. OSA     H/o septoplasty 4. Obesity  Procedures This Admission:  1.  Electrophysiology study and radiofrequency catheter ablation on 10/17/2018 by Dr [name].  This study demonstrated  CONCLUSIONS: 1. Sinus rhythm upon presentation.   2. Intracardiac echo reveals a moderate sized left atrium with four separate pulmonary veins without evidence of pulmonary vein stenosis. 3. Successful electrical isolation and anatomical encircling of all four pulmonary veins with radiofrequency current. 4. No inducible arrhythmias following ablation both on and off of Isuprel 5. No early apparent complications     Brief HPI: Ian Matthews is a 68 y.o. male with a history of symptomatic paroxysmal atrial fibrillation.   Risks, benefits, and alternatives to catheter ablation of atrial fibrillation were reviewed with the patient who wished to proceed.  The patient underwent cardiac CT prior to the procedure which demonstrated normal LV function and no LAA thrombus.    Hospital Course:  The patient was admitted and underwent EPS/RFCA of atrial fibrillation with details as outlined above.  The patient was monitored on telemetry overnight which demonstrated SB/SR 50's-60's.  R groin was without complication on the day of discharge.  The patient feels well this morning, no CP or SOB, no procedure site discomfort.  He was examined by Dr.  Johney Frame and considered to be stable for discharge.  Wound care and restrictions were reviewed with the patient.  The patient will be  seen back by the AFib clinic in 4 weeks and Dr Johney Frame in 12 weeks for post ablation follow up.      Physical Exam: Vitals:   10/17/18 2058 10/17/18 2100 10/18/18 0343 10/18/18 0735  BP: 136/87 136/87 122/76 113/83  Pulse: 70 90 60 66  Resp: 19 (!) 21 17 18   Temp:   97.9 F (36.6 C) 98.1 F (36.7 C)  TempSrc: Oral  Oral Oral  SpO2: 98% 96% 100% 98%  Weight:   105 kg   Height:        GEN- The patient is well appearing, alert and oriented x 3 today.   HEENT: normocephalic, atraumatic; sclera clear, conjunctiva pink; hearing intact; oropharynx clear; neck supple  Lungs- CTA b/l, normal work of breathing.  No wheezes, rales, rhonchi Heart-  RRR, no murmurs, rubs or gallops  GI- soft, non-tender, non-distended, bowel sounds present  Extremities- no clubbing, cyanosis, or edema; DP/PT pulses 2+ bilaterally, R groin without hematoma/bruit MS- no significant deformity or atrophy Skin- warm and dry, no rash or lesion Psych- euthymic mood, full affect Neuro- strength and sensation are intact   Labs:  No results found for: WBC, HGB, HCT, MCV, PLT No results for input(s): NA, K, CL, CO2, BUN, CREATININE, CALCIUM, PROT, BILITOT, ALKPHOS, ALT, AST, GLUCOSE in the last 168 hours.  Invalid input(s): LABALBU   Discharge Medications:  Allergies as of 10/18/2018   No Known Allergies     Medication List    TAKE these medications   apixaban 5 MG Tabs tablet Commonly known as:  Eliquis Take 1 tablet (5 mg total) by mouth 2 (two) times daily.   aspirin-acetaminophen-caffeine  250-250-65 MG tablet Commonly known as:  EXCEDRIN MIGRAINE Take 2 tablets by mouth daily as needed for headache.   doxycycline 100 MG capsule Commonly known as:  VIBRAMYCIN Take 100 mg by mouth 2 (two) times daily as needed (rosacea).   ergocalciferol 1.25 MG (50000 UT) capsule Commonly known as:  VITAMIN D2 Take 50,000 Units by mouth every Thursday.   flecainide 100 MG tablet Commonly known as:   TAMBOCOR Take 3 tablets as needed by mouth for breakthrough afib daily What changed:    how much to take  how to take this  when to take this  reasons to take this  additional instructions   levothyroxine 100 MCG tablet Commonly known as:  SYNTHROID, LEVOTHROID Take 100 mcg by mouth at bedtime.   metoprolol succinate 25 MG 24 hr tablet Commonly known as:  TOPROL-XL Take 25 mg by mouth at bedtime.   metoprolol tartrate 25 MG tablet Commonly known as:  LOPRESSOR Take 1-2 tablets (25-50 mg total) by mouth every 6 (six) hours as needed (for breakthrough afib).   pantoprazole 40 MG tablet Commonly known as:  Protonix Take 1 tablet (40 mg total) by mouth daily.   rosuvastatin 5 MG tablet Commonly known as:  CRESTOR Take 5 mg by mouth at bedtime.       Disposition:   Follow-up Information    [facility] Follow up.   Specialty:  Cardiology Why:  11/14/2018 @ 10:30AM Contact information: [address] [phone] mc [address] [place] [address] [phone]       [name], MD Follow up.   Specialty:  Cardiology Why:  01/29/2019 @ 10:30AM Contact information: [address] [place] [address] [phone]           Duration of Discharge Encounter: Greater than 30 minutes including physician time.  Signed, Francis Dowse, PA-C 10/18/2018 11:04 AM   I have seen, examined the patient, and reviewed the above assessment and plan.  Changes to above are made where necessary.  On exam, RRR.  Doing well s/p ablation.  DC to home with routine wound care and follow-up..  Co Sign: [name], MD 10/18/2018 10:05 PM

## 2018-10-17 NOTE — Interval H&P Note (Signed)
History and Physical Interval Note:  10/17/2018 7:37 AM  Ian Matthews  has presented today for surgery, with the diagnosis of atrial fibrillation.  The various methods of treatment have been discussed with the patient and family. After consideration of risks, benefits and other options for treatment, the patient has consented to  Procedure(s): ATRIAL FIBRILLATION ABLATION (N/A) as a surgical intervention.  The patient's history has been reviewed, patient examined, no change in status, stable for surgery.  I have reviewed the patient's chart and labs.  Questions were answered to the patient's satisfaction.    Cardiac CT reviewed at length with patient today.  He reports compliance with eliquis  [name]

## 2018-10-17 NOTE — Transfer of Care (Signed)
Immediate Anesthesia Transfer of Care Note  Patient: Ian Matthews  Procedure(s) Performed: ATRIAL FIBRILLATION ABLATION (N/A )  Patient Location: Cath Lab  Anesthesia Type:General  Level of Consciousness: awake, alert  and oriented  Airway & Oxygen Therapy: Patient connected to face mask oxygen  Post-op Assessment: Post -op Vital signs reviewed and stable  Post vital signs: stable  Last Vitals:  Vitals Value Taken Time  BP    Temp    Pulse    Resp    SpO2      Last Pain:  Vitals:   10/17/18 0559  TempSrc:   PainSc: 0-No pain      Patients Stated Pain Goal: 3 (10/17/18 0559)  Complications: No apparent anesthesia complications

## 2018-10-17 NOTE — Discharge Instructions (Signed)
Post procedure care instructions °No driving for 4 days. No lifting over 5 lbs for 1 week. No vigorous or sexual activity for 1 week. You may return to work on 10/24/2018. Keep procedure site clean & dry. If you notice increased pain, swelling, bleeding or pus, call/return!  You may shower, but no soaking baths/hot tubs/pools for 1 week.  ° °You have an appointment set up with the Atrial Fibrillation Clinic.  Multiple studies have shown that being followed by a dedicated atrial fibrillation clinic in addition to the standard care you receive from your other physicians improves health. We believe that enrollment in the atrial fibrillation clinic will allow us to better care for you.  ° °The phone number to the Atrial Fibrillation Clinic is [phone]. The clinic is staffed Monday through Friday from 8:30am to 5pm. ° °Parking Directions: The clinic is located in the Heart and Vascular Building connected to [facility]. °1)From [address] turn on to [address] and go to the 3rd entrance  (Heart and Vascular entrance) on the right. °2)Look to the right for Heart &Vascular Parking Garage. °3)A code for the entrance is required please call the clinic to receive this.   °4)Take the elevators to the 1st floor. Registration is in the room with the glass walls at the end of the hallway. ° °If you have any trouble parking or locating the clinic, please don’t hesitate to call [phone]. °

## 2018-10-17 NOTE — Anesthesia Postprocedure Evaluation (Signed)
Anesthesia Post Note  Patient: Renso Speigner  Procedure(s) Performed: ATRIAL FIBRILLATION ABLATION (N/A )     Patient location during evaluation: PACU Anesthesia Type: General Level of consciousness: awake and alert Pain management: pain level controlled Vital Signs Assessment: post-procedure vital signs reviewed and stable Respiratory status: spontaneous breathing, nonlabored ventilation and respiratory function stable Cardiovascular status: blood pressure returned to baseline and stable Postop Assessment: no apparent nausea or vomiting Anesthetic complications: no    Last Vitals:  Vitals:   10/17/18 1255 10/17/18 1438  BP: 120/74 127/84  Pulse: 64 (!) 59  Resp: 17 16  Temp:  36.7 C  SpO2: 95% 95%    Last Pain:  Vitals:   10/17/18 1438  TempSrc: Oral  PainSc:                  Cecile Hearing

## 2018-10-18 DIAGNOSIS — I480 Paroxysmal atrial fibrillation: Secondary | ICD-10-CM | POA: Diagnosis not present

## 2018-10-18 DIAGNOSIS — E039 Hypothyroidism, unspecified: Secondary | ICD-10-CM | POA: Diagnosis not present

## 2018-10-18 DIAGNOSIS — E559 Vitamin D deficiency, unspecified: Secondary | ICD-10-CM | POA: Diagnosis not present

## 2018-10-18 DIAGNOSIS — E669 Obesity, unspecified: Secondary | ICD-10-CM | POA: Diagnosis not present

## 2018-10-18 MED ORDER — PANTOPRAZOLE SODIUM 40 MG PO TBEC: 40.0000 mg | DELAYED_RELEASE_TABLET | Freq: Every day | ORAL | 0 refills | Status: DC

## 2018-10-18 NOTE — TOC Progression Note (Signed)
Transition of Care [facility]) - Progression Note    Patient Details  Name: Ian Matthews MRN: [id number] Date of Birth: [birth year]  [facility] CM/SW Contact  Lawerance Sabal, RN Phone Number: 10/18/2018, 10:16 AM  Clinical Narrative:            Expected Discharge Plan and Services                          Spoke w patient at bedside. He states he has Eluis samples at home and 30 day coupon card. He understands how to use card. No other CM needs identified         Social Determinants of Health (SDOH) Interventions    Readmission Risk Interventions  No flowsheet data found.

## 2018-10-18 NOTE — TOC Benefit Eligibility Note (Signed)
Transition of Care [facility]) Benefit Eligibility Note    Patient Details  Name: Ian Matthews MRN: [id number] Date of Birth: 1951/05/07   Medication/Dose: ELIQUIS  5 MG BID Q/L TWO PILL PER DAY  Covered?: Yes  Tier: 3 Drug  Prescription Coverage Preferred Pharmacy: [facility]  Spoke with Person/Company/Phone Number:: [employer] @ [facility] RX #  [phone]  Co-Pay: $ 47.00  Prior Approval: No          Mardene Sayer Phone Number: 10/18/2018, 9:31 AM

## 2018-11-14 ENCOUNTER — Ambulatory Visit (HOSPITAL_COMMUNITY): Payer: Medicare Other | Admitting: Nurse Practitioner

## 2018-11-26 ENCOUNTER — Other Ambulatory Visit: Payer: Self-pay | Admitting: Physician Assistant

## 2019-01-26 ENCOUNTER — Telehealth: Payer: Self-pay

## 2019-01-26 NOTE — Telephone Encounter (Signed)
Spoke with pt regarding appt on 01/29/19. Pt stated he will check vitals prior to appt. Pt questions and concerns were address.

## 2019-01-29 ENCOUNTER — Encounter: Payer: Self-pay | Admitting: Internal Medicine

## 2019-01-29 ENCOUNTER — Telehealth (INDEPENDENT_AMBULATORY_CARE_PROVIDER_SITE_OTHER): Payer: Medicare Other | Admitting: Internal Medicine

## 2019-01-29 VITALS — BP 130/70 | HR 74 | Ht 74.0 in | Wt 224.0 lb

## 2019-01-29 DIAGNOSIS — I480 Paroxysmal atrial fibrillation: Secondary | ICD-10-CM

## 2019-01-29 DIAGNOSIS — G4733 Obstructive sleep apnea (adult) (pediatric): Secondary | ICD-10-CM

## 2019-01-29 NOTE — Progress Notes (Signed)
Electrophysiology TeleHealth Note   Due to national recommendations of social distancing due to COVID 19, an audio/video telehealth visit is felt to be most appropriate for this patient at this time.  Verbal consent was obtained by me for the telehealth visit today.  Facetime was used for the visit.  Date:  01/29/2019   ID:  Ian Matthews, DOB [birth year], MRN [id number]  Location: patient's home  Provider location:  [facility]  Evaluation Performed: Follow-up visit  PCP:  Moshe Cipro, MD   Electrophysiologist:  Dr Rayann Heman  Chief Complaint:  palpitations  History of Present Illness:    Ian Matthews is a 68 y.o. male who presents via telehealth conferencing today.  Since his ablation, the patient reports doing very well.  Denies procedure related complications.  Pleased with results.  Today, he denies symptoms of palpitations, chest pain, shortness of breath,  lower extremity edema, dizziness, presyncope, or syncope.  The patient is otherwise without complaint today.  The patient denies symptoms of fevers, chills, cough, or new SOB worrisome for COVID 19.  Past Medical History:  Diagnosis Date  . Calcaneal spur   . Hypercholesterolemia   . Hypogonadism in male   . Hypothyroidism   . Neuropathy   . Obesity (BMI 35.0-39.9 without comorbidity)   . Obstructive sleep apnea    s/p septoplasty  . Vitamin D deficiency     Past Surgical History:  Procedure Laterality Date  . ATRIAL FIBRILLATION ABLATION N/A 10/17/2018   Procedure: ATRIAL FIBRILLATION ABLATION;  Surgeon: Thompson Grayer, MD;  Location: [facility];  Service: Cardiovascular;  Laterality: N/A;  . HEMORRHOID SURGERY    . SEPTOPLASTY    . TONSILLECTOMY      Current Outpatient Medications  Medication Sig Dispense Refill  . apixaban (ELIQUIS) 5 MG TABS tablet Take 1 tablet (5 mg total) by mouth 2 (two) times daily. 60 tablet 11  . aspirin-acetaminophen-caffeine (EXCEDRIN MIGRAINE) 250-250-65 MG tablet Take 2  tablets by mouth daily as needed for headache.    . ergocalciferol (VITAMIN D2) 1.25 MG (50000 UT) capsule Take 50,000 Units by mouth every Thursday.     . flecainide (TAMBOCOR) 100 MG tablet Take 3 tablets as needed by mouth for breakthrough afib daily (Patient taking differently: Take 300 mg by mouth daily as needed (afib). ) 6 tablet 3  . levothyroxine (SYNTHROID, LEVOTHROID) 100 MCG tablet Take 100 mcg by mouth at bedtime.     . rosuvastatin (CRESTOR) 5 MG tablet Take 5 mg by mouth at bedtime.      No current facility-administered medications for this visit.     Allergies:   Patient has no known allergies.   Social History:  The patient  reports that he has never smoked. He has never used smokeless tobacco. He reports previous alcohol use. He reports that he does not use drugs.   Family History:  The patient's  family history includes Alzheimer's disease in his mother; Atrial fibrillation in his mother and sister; Cancer in his father; Diabetes in his father.   ROS:  Please see the history of present illness.   All other systems are personally reviewed and negative.    Exam:    Vital Signs:  BP 130/70   Pulse 74   Ht 6\' 2"  (1.88 m)   Wt 224 lb (101.6 kg)   SpO2 96%   BMI 28.76 kg/m   Well sounding today   Labs/Other Tests and Data Reviewed:    Recent Labs:  No results found for requested labs within last 8760 hours.   Wt Readings from Last 3 Encounters:  01/29/19 224 lb (101.6 kg)  10/18/18 231 lb 7.7 oz (105 kg)  09/28/18 238 lb (108 kg)     ASSESSMENT & PLAN:    1.  Paroxysmal atrial fibrillation Doing great post ablation off AAD therapy chads2vasc score is 1.  Stop eliquis at this time as per patient preference  2. Overweight Lifestyle modification is encouraged  3. OSA He feels this has resolved with septoplasty Consider sleep study in the future   Follow-up:  3 months with me   Patient Risk:  after full review of this patients clinical status, I feel  that they are at moderate risk at this time.  Today, I have spent 15 minutes with the patient with telehealth technology discussing arrhythmia management .    Ian IdolSigned, Jeanette Moffatt, MD  01/29/2019 10:47 AM     [facility] [address] [place] [address] [phone] (office) [phone] (fax)

## 2019-09-25 ENCOUNTER — Encounter: Payer: Self-pay | Admitting: Internal Medicine

## 2019-09-25 ENCOUNTER — Telehealth (INDEPENDENT_AMBULATORY_CARE_PROVIDER_SITE_OTHER): Payer: Medicare Other | Admitting: Internal Medicine

## 2019-09-25 VITALS — BP 104/76 | HR 60 | Ht 74.0 in | Wt 226.0 lb

## 2019-09-25 DIAGNOSIS — I480 Paroxysmal atrial fibrillation: Secondary | ICD-10-CM

## 2019-09-25 DIAGNOSIS — G4733 Obstructive sleep apnea (adult) (pediatric): Secondary | ICD-10-CM

## 2019-09-25 NOTE — Progress Notes (Signed)
Electrophysiology TeleHealth Note   Due to national recommendations of social distancing due to COVID 19, an audio/video telehealth visit is felt to be most appropriate for this patient at this time.  See MyChart message from today for the patient's consent to telehealth for [facility].  Date:  09/25/2019   ID:  Ian Matthews, DOB 07/03/1951, MRN [id number]  Location: patient's home  Provider location:  [facility]  Evaluation Performed: Follow-up visit  PCP:  Moshe Cipro, MD   Electrophysiologist:  Dr Rayann Heman  Chief Complaint:  palpitations  History of Present Illness:    Ian Matthews is a 69 y.o. male who presents via telehealth conferencing today.  Since last being seen in our clinic, the patient reports doing very well.  Today, he denies symptoms of palpitations, chest pain, shortness of breath,  lower extremity edema, dizziness, presyncope, or syncope.  The patient is otherwise without complaint today.  The patient denies symptoms of fevers, chills, cough, or new SOB worrisome for COVID 19.  He has had the first part of his COVID shot.  Past Medical History:  Diagnosis Date  . Calcaneal spur   . Hypercholesterolemia   . Hypogonadism in male   . Hypothyroidism   . Neuropathy   . Obesity (BMI 35.0-39.9 without comorbidity)   . Obstructive sleep apnea    s/p septoplasty  . Paroxysmal atrial fibrillation (HCC)   . Vitamin D deficiency     Past Surgical History:  Procedure Laterality Date  . ATRIAL FIBRILLATION ABLATION N/A 10/17/2018   Procedure: ATRIAL FIBRILLATION ABLATION;  Surgeon: Thompson Grayer, MD;  Location: [place] [facility];  Service: Cardiovascular;  Laterality: N/A;  . HEMORRHOID SURGERY    . SEPTOPLASTY    . TONSILLECTOMY      Current Outpatient Medications  Medication Sig Dispense Refill  . aspirin-acetaminophen-caffeine (EXCEDRIN MIGRAINE) 250-250-65 MG tablet Take 2 tablets by mouth daily as needed for headache.    . doxycycline (VIBRAMYCIN)  100 MG capsule Take 100 mg by mouth as needed for rash.    . ergocalciferol (VITAMIN D2) 1.25 MG (50000 UT) capsule Take 50,000 Units by mouth every Thursday.     . levothyroxine (SYNTHROID, LEVOTHROID) 100 MCG tablet Take 100 mcg by mouth at bedtime.     . rosuvastatin (CRESTOR) 5 MG tablet Take 5 mg by mouth at bedtime.      No current facility-administered medications for this visit.    Allergies:   Patient has no known allergies.   Social History:  The patient  reports that he has never smoked. He has never used smokeless tobacco. He reports previous alcohol use. He reports that he does not use drugs.   Family History:  The patient's family history includes Alzheimer's disease in his mother; Atrial fibrillation in his mother and sister; Cancer in his father; Diabetes in his father.   ROS:  Please see the history of present illness.   All other systems are personally reviewed and negative.    Exam:    Vital Signs:  BP 104/76   Pulse 60   Ht 6\' 2"  (1.88 m)   Wt 226 lb (102.5 kg)   BMI 29.02 kg/m   Well sounding and appearing, alert and conversant, regular work of breathing,  good skin color Eyes- anicteric, neuro- grossly intact, skin- no apparent rash or lesions or cyanosis, mouth- oral mucosa is pink  Labs/Other Tests and Data Reviewed:    Recent Labs: No results found for requested labs within  last 8760 hours.   Wt Readings from Last 3 Encounters:  09/25/19 226 lb (102.5 kg)  01/29/19 224 lb (101.6 kg)  10/18/18 231 lb 7.7 oz (105 kg)     ASSESSMENT & PLAN:    1.  Paroxysmal atrial fibrillation Doing very well post ablation off AAD therapy chads2vasc score is 1.  Does not require OAC therapy  2. OSA S/p septoplasty  3. Overweight Lifestyle modification is encouraged   Follow-up:  12 months with me   Patient Risk:  after full review of this patients clinical status, I feel that they are at moderate risk at this time.  Today, I have spent 15 minutes with  the patient with telehealth technology discussing arrhythmia management .    Randolm Idol, MD  09/25/2019 8:47 AM     [facility] [address] [place] [address] [phone] (office) [phone] (fax)

## 2020-05-13 IMAGING — CT CT HEART MORPH/PULM VEIN W/ CM & W/O CA SCORE
2 of 9 series · 6 of 20 positions shown, 7 images · IV contrast (APPLIED)
Comparison: None.
COMPARISON: None.
COMPARISON: None.

Addendum:
EXAM:
OVER-READ INTERPRETATION  CT CHEST

The following report is an over-read performed by radiologist Dr.
Carl-Magnus Nahimana [REDACTED] on 10/10/2018. This over-read
does not include interpretation of cardiac or coronary anatomy or
pathology. The coronary CTA interpretation by the cardiologist is
attached.
CLINICAL DATA: 67-year-old male with h/o atrial fibrillation
scheduled for an ablation.
Cardiac CT/CTA
TECHNIQUE: The patient was scanned on a Siemens Somatom scanner.

[Series 10: best diast · axial · 0.37mm/px · z∈[+1157,+1222]mm · 3 of 325 slices shown, 4 images]
[im 82/325  vessel]
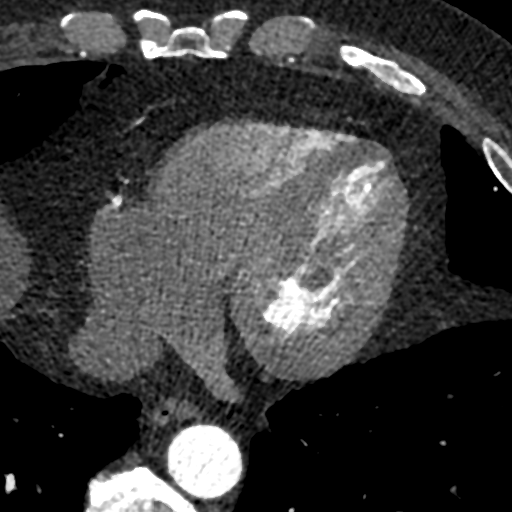
[im 82/325  lung]
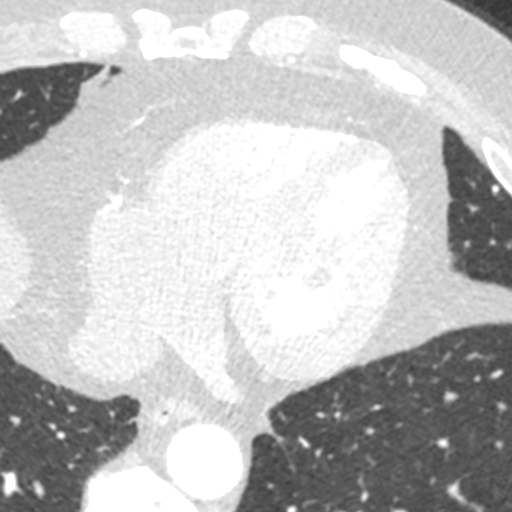
[im 163/325  vessel]
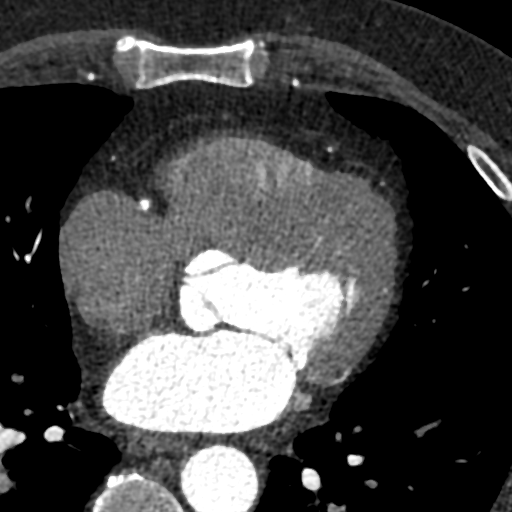
[im 244/325  vessel]
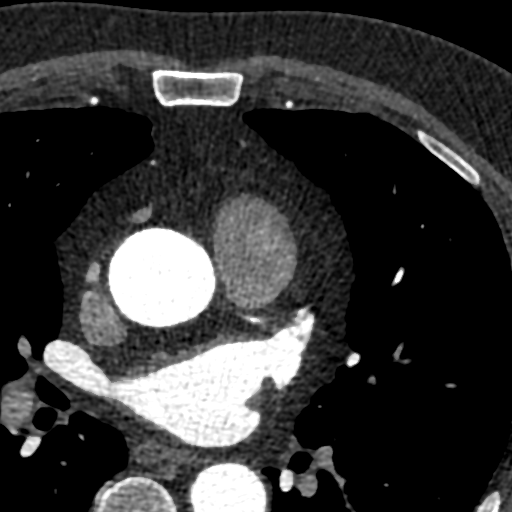

[Series 11: +300 ms · axial · 0.37mm/px · z∈[+1157,+1222]mm · 3 of 325 slices shown]
[im 82/325  vessel]
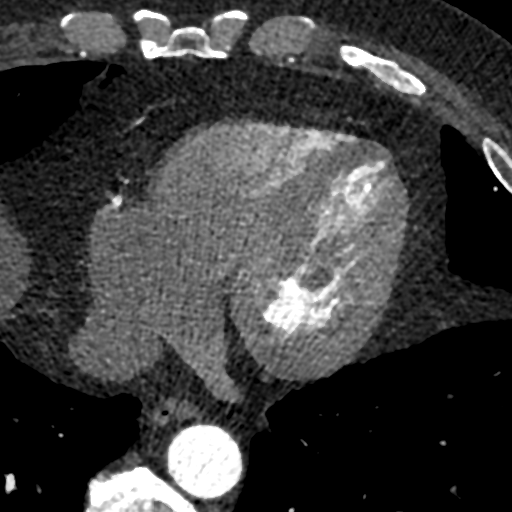
[im 163/325  vessel]
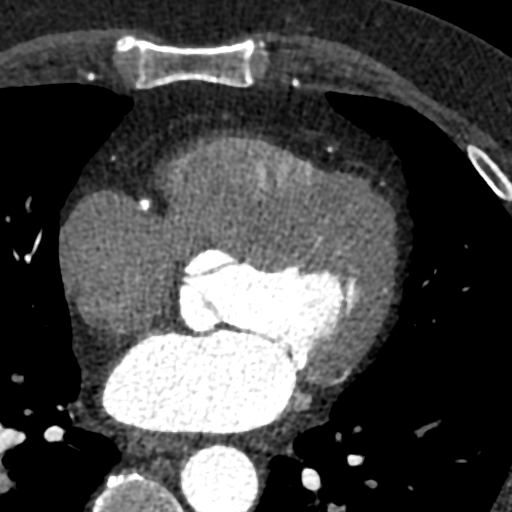
[im 244/325  vessel]
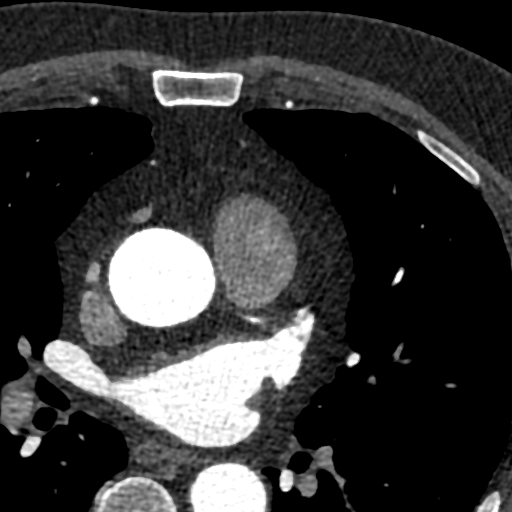

[6 of 20 positions shown; findings below may reference images not displayed]

FINDINGS: Vascular: Heart is normal size.  Aorta is normal caliber.

Mediastinum/Nodes: No adenopathy in the lower mediastinum or hila.

Lungs/Pleura: 2 mm nodule in the posterior right lower lobe.
Adjacent 2 mm calcified granuloma. No effusions.

Upper Abdomen: Imaging into the upper abdomen shows no acute
findings.

Musculoskeletal: Chest wall soft tissues are unremarkable. No acute
bony abnormality.
IMPRESSION: 2 mm calcified granuloma in the right lower lobe with 2 mm adjacent
noncalcified nodule. No follow-up needed if patient is low-risk.
Non-contrast chest CT can be considered in 12 months if patient is
high-risk. This recommendation follows the consensus statement:
Guidelines for Management of Incidental Pulmonary Nodules Detected
FINDINGS: A 120 kV prospective scan was triggered in the descending thoracic
aorta at 111 HU's. Gantry rotation speed was 280 msecs and
collimation was .9 mm. No beta blockade and no NTG was given. The 3D
data set was reconstructed in 5% intervals of the 60-80 % of the R-R
cycle. Diastolic phases were analyzed on a dedicated work station
using MPR, MIP and VRT modes. The patient received 80 cc of
contrast.

There is normal pulmonary vein drainage into the left atrium (2 on
the right and 2 on the left) with ostial measurements as follows:

RUPV: 19.0 x 16.3 mm

RLPV: 18.0 x 14.9 mm

LUPV: 19.1 x 16.3 mm

LLPV: 19.1 x 11.4 mm

The left atrial appendage is medium size with no evidence for a
thrombus.

The esophagus runs in the left atrial midline and is not in the
proximity to any of the pulmonary veins.

Aorta:  Normal caliber.  No dissection or calcifications.

Aortic Valve:  Trileaflet.  No calcifications.

Coronary Arteries: Normal coronary origin. Right dominance. The
study was performed without use of NTG, however calcium score is 0
and there are only minimal irregularities.
IMPRESSION: 1. There is normal pulmonary vein drainage into the left atrium.

2. The left atrial appendage is medium size with no evidence for a
thrombus.

3. The esophagus runs in the left atrial midline and is not in the
proximity to any of the pulmonary veins.

4. Coronary Arteries: Normal coronary origin. Right dominance. The
study was performed without use of NTG, however calcium score is 0
and there are only minimal irregularities.

*** End of Addendum ***
Addendum:
EXAM:
OVER-READ INTERPRETATION  CT CHEST

The following report is an over-read performed by radiologist Dr.
Carl-Magnus Nahimana [REDACTED] on 10/10/2018. This over-read
does not include interpretation of cardiac or coronary anatomy or
pathology. The coronary CTA interpretation by the cardiologist is
attached.
FINDINGS: Vascular: Heart is normal size.  Aorta is normal caliber.

Mediastinum/Nodes: No adenopathy in the lower mediastinum or hila.

Lungs/Pleura: 2 mm nodule in the posterior right lower lobe.
Adjacent 2 mm calcified granuloma. No effusions.

Upper Abdomen: Imaging into the upper abdomen shows no acute
findings.

Musculoskeletal: Chest wall soft tissues are unremarkable. No acute
bony abnormality.
IMPRESSION: 2 mm calcified granuloma in the right lower lobe with 2 mm adjacent
noncalcified nodule. No follow-up needed if patient is low-risk.
Non-contrast chest CT can be considered in 12 months if patient is
high-risk. This recommendation follows the consensus statement:
Guidelines for Management of Incidental Pulmonary Nodules Detected
FINDINGS: A 120 kV prospective scan was triggered in the descending thoracic
aorta at 111 HU's. Gantry rotation speed was 280 msecs and
collimation was .9 mm. No beta blockade and no NTG was given. The 3D
data set was reconstructed in 5% intervals of the 60-80 % of the R-R
cycle. Diastolic phases were analyzed on a dedicated work station
using MPR, MIP and VRT modes. The patient received 80 cc of
contrast.

There is normal pulmonary vein drainage into the left atrium (2 on
the right and 2 on the left) with ostial measurements as follows:

RUPV: 19.0 x 16.3 mm

RLPV: 18.0 x 14.9 mm

LUPV: 19.1 x 16.3 mm

LLPV: 19.1 x 11.4 mm

The left atrial appendage is medium size with no evidence for a
thrombus.

The esophagus runs in the left atrial midline and is not in the
proximity to any of the pulmonary veins.

Aorta:  Normal caliber.  No dissection or calcifications.

Aortic Valve:  Trileaflet.  No calcifications.

Coronary Arteries: Normal coronary origin. Right dominance. The
study was performed without use of NTG, however calcium score is 0
and there are only minimal irregularities.
IMPRESSION: 1. There is normal pulmonary vein drainage into the left atrium.

2. The left atrial appendage is medium size with no evidence for a
thrombus.

3. The esophagus runs in the left atrial midline and is not in the
proximity to any of the pulmonary veins.

4. Coronary Arteries: Normal coronary origin. Right dominance. The
study was performed without use of NTG, however calcium score is 0
and there are only minimal irregularities.

*** End of Addendum ***
EXAM:
OVER-READ INTERPRETATION  CT CHEST

The following report is an over-read performed by radiologist Dr.
Carl-Magnus Nahimana [REDACTED] on 10/10/2018. This over-read
does not include interpretation of cardiac or coronary anatomy or
pathology. The coronary CTA interpretation by the cardiologist is
attached.
FINDINGS: Vascular: Heart is normal size.  Aorta is normal caliber.

Mediastinum/Nodes: No adenopathy in the lower mediastinum or hila.

Lungs/Pleura: 2 mm nodule in the posterior right lower lobe.
Adjacent 2 mm calcified granuloma. No effusions.

Upper Abdomen: Imaging into the upper abdomen shows no acute
findings.

Musculoskeletal: Chest wall soft tissues are unremarkable. No acute
bony abnormality.
IMPRESSION: 2 mm calcified granuloma in the right lower lobe with 2 mm adjacent
noncalcified nodule. No follow-up needed if patient is low-risk.
Non-contrast chest CT can be considered in 12 months if patient is
high-risk. This recommendation follows the consensus statement:
Guidelines for Management of Incidental Pulmonary Nodules Detected

## 2020-12-01 ENCOUNTER — Ambulatory Visit: Payer: Medicare Other | Admitting: Internal Medicine

## 2020-12-01 DIAGNOSIS — G4733 Obstructive sleep apnea (adult) (pediatric): Secondary | ICD-10-CM

## 2020-12-01 DIAGNOSIS — I480 Paroxysmal atrial fibrillation: Secondary | ICD-10-CM

## 2020-12-22 ENCOUNTER — Telehealth: Payer: Self-pay

## 2020-12-22 ENCOUNTER — Ambulatory Visit: Payer: Medicare Other | Admitting: Internal Medicine

## 2020-12-22 DIAGNOSIS — I480 Paroxysmal atrial fibrillation: Secondary | ICD-10-CM

## 2020-12-22 DIAGNOSIS — G4733 Obstructive sleep apnea (adult) (pediatric): Secondary | ICD-10-CM

## 2020-12-22 NOTE — Telephone Encounter (Signed)
Spoke with pt regarding appt on 12/22/20. Pt stated he would like to reschedule his appt.

## 2021-01-26 ENCOUNTER — Encounter: Payer: Self-pay | Admitting: Internal Medicine

## 2021-01-26 ENCOUNTER — Ambulatory Visit (INDEPENDENT_AMBULATORY_CARE_PROVIDER_SITE_OTHER): Payer: Medicare Other | Admitting: Internal Medicine

## 2021-01-26 ENCOUNTER — Other Ambulatory Visit: Payer: Self-pay

## 2021-01-26 VITALS — BP 124/82 | HR 82 | Ht 74.0 in | Wt 240.8 lb

## 2021-01-26 DIAGNOSIS — G4733 Obstructive sleep apnea (adult) (pediatric): Secondary | ICD-10-CM

## 2021-01-26 DIAGNOSIS — I480 Paroxysmal atrial fibrillation: Secondary | ICD-10-CM

## 2021-01-26 MED ORDER — METOPROLOL TARTRATE 25 MG PO TABS: 25.0000 mg | ORAL_TABLET | Freq: Four times a day (QID) | ORAL | 3 refills | Status: DC | PRN

## 2021-01-26 NOTE — Progress Notes (Signed)
   PCP: Romeo Rabon, MD   Primary EP: Dr Hermenia Fiscal Taher is a 69 y.o. male who presents today for routine electrophysiology followup.  Since last being seen in our clinic, the patient reports doing reasonably well.  He has begun having palpitations about once per month lasting up to 30 minutes.  He suspects that this may be afib.  Today, he denies symptoms of chest pain, shortness of breath,  lower extremity edema, dizziness, presyncope, or syncope.  The patient is otherwise without complaint today.   Past Medical History:  Diagnosis Date   Calcaneal spur    Hypercholesterolemia    Hypogonadism in male    Hypothyroidism    Neuropathy    Obesity (BMI 35.0-39.9 without comorbidity)    Obstructive sleep apnea    s/p septoplasty   Paroxysmal atrial fibrillation (HCC)    Vitamin D deficiency    Past Surgical History:  Procedure Laterality Date   ATRIAL FIBRILLATION ABLATION N/A 10/17/2018   Procedure: ATRIAL FIBRILLATION ABLATION;  Surgeon: [name], MD;  Location: [facility];  Service: Cardiovascular;  Laterality: N/A;   HEMORRHOID SURGERY     SEPTOPLASTY     TONSILLECTOMY      ROS- all systems are reviewed and negatives except as per HPI above  Current Outpatient Medications  Medication Sig Dispense Refill   aspirin-acetaminophen-caffeine (EXCEDRIN MIGRAINE) 250-250-65 MG tablet Take 2 tablets by mouth daily as needed for headache.     doxycycline (VIBRAMYCIN) 100 MG capsule Take 100 mg by mouth as needed for rash.     ergocalciferol (VITAMIN D2) 1.25 MG (50000 UT) capsule Take 50,000 Units by mouth every Thursday.      levothyroxine (SYNTHROID, LEVOTHROID) 100 MCG tablet Take 100 mcg by mouth at bedtime.      meloxicam (MOBIC) 15 MG tablet Take 1 tablet by mouth daily as needed.     rosuvastatin (CRESTOR) 5 MG tablet Take 5 mg by mouth at bedtime.      No current facility-administered medications for this visit.    Physical Exam: Vitals:   01/26/21 1609   BP: 124/82  Pulse: 82  SpO2: 95%  Weight: 240 lb 12.8 oz (109.2 kg)  Height: 6\' 2"  (1.88 m)    GEN- The patient is well appearing, alert and oriented x 3 today.   Head- normocephalic, atraumatic Eyes-  Sclera clear, conjunctiva pink Ears- hearing intact Oropharynx- clear Lungs- Clear to ausculation bilaterally, normal work of breathing Heart- Regular rate and rhythm, no murmurs, rubs or gallops, PMI not laterally displaced GI- soft, NT, ND, + BS Extremities- no clubbing, cyanosis, or edema  Wt Readings from Last 3 Encounters:  01/26/21 240 lb 12.8 oz (109.2 kg)  09/25/19 226 lb (102.5 kg)  01/29/19 224 lb (101.6 kg)    EKG tracing ordered today is personally reviewed and shows sinus  Assessment and Plan:  Paroxysmal atrial fibrillation Well controlled post ablation off AAD therapy Chads2vasc score is 1.  As per guidelines, he has decided using a shared decision making process to avoid OAC at this time. Given palpitations, I have advised he obtain Kardiamobile device to further evaluate for arrhythmias as cause of his palpitations.  We discussed 30 day monitor and ILR as an alternative.  2. OSA S/p septoplasty  Return in 2 months to further discuss palpitations.  01/31/19 MD, [facility] 01/26/2021 4:15 PM

## 2021-01-26 NOTE — Patient Instructions (Addendum)
Medication Instructions:  Start Metoprolol 25 mg every 6 hours as needed for palpitations.  Your physician recommends that you continue on your current medications as directed. Please refer to the Current Medication list given to you today.  Labwork: None ordered.  Testing/Procedures: None ordered.  Follow-Up: Your physician wants you to follow-up in: 04/03/21 at 10:30 am with Dr. Johney Frame.   Any Other Special Instructions Will Be Listed Below (If Applicable).  If you need a refill on your cardiac medications before your next appointment, please call your pharmacy.   [employer]  FDA-cleared EKG at your fingertips. - [employer]   [employer]. [email or web address]://store.alivecor.[email or web address]/[email or web address]   FDA-cleared, clinical grade mobile EKG monitor: Lourena Simmonds is the most clinically-validated mobile EKG used by the world's leading cardiac care medical professionals.  This may be useful in monitoring palpitations.  We do not have access to have them emailed and reviewed but will be glad to review while in the office.

## 2021-02-04 ENCOUNTER — Other Ambulatory Visit: Payer: Self-pay | Admitting: Internal Medicine

## 2021-02-11 ENCOUNTER — Telehealth: Payer: Self-pay | Admitting: Internal Medicine

## 2021-02-11 MED ORDER — METOPROLOL TARTRATE 25 MG PO TABS: 25.0000 mg | ORAL_TABLET | Freq: Four times a day (QID) | ORAL | 3 refills | Status: DC | PRN

## 2021-02-11 NOTE — Telephone Encounter (Signed)
Patient c/o Palpitations:  High priority if patient c/o lightheadedness, shortness of breath, or chest pain  How long have you had palpitations/irregular HR/ Afib? Are you having the symptoms now? Yes  Are you currently experiencing lightheadedness, SOB or CP?no   Do you have a history of afib (atrial fibrillation) or irregular heart rhythm? yes  Have you checked your BP or HR? (document readings if available): HR 143  Are you experiencing any other symptoms? Rapid heart rate and very weak

## 2021-02-11 NOTE — Telephone Encounter (Signed)
After metoprolol tartrate Current BP 125/90 HR 75. Kardia 78 in NSR now. The patient is going to work with the help desk to get his mychart fixed. He wishes to send in the readings for his records.

## 2021-02-11 NOTE — Telephone Encounter (Signed)
Spoke with pt and pt's wife who reports pt's [facility] device which shows Afib with rate of 143bpm.   Pt denies current CP, SOB or dizziness.  Pt states he felt rapid heart rate start about 11am this morning.  Pt has just taken Metoprolol Tart 25mg  - 1 tablet about 20 minutes ago.    Pt is asking for assistance in sending Kardia mobile strips.  Pt also states [facility] has not received new Rx for Metoprolol Tart that was refilled at his 01/26/2021 appt with Dr 01/28/2021.  Provided pt with MyChart Help Desk phone number of [phone] and encouraged to call for assistance in attaching file to MyChart message.  RN resent Metoprolol refill to [facility] and confirmed pharmacy information is correct.  Pharmacy confirms receipt of Rx.  Reviewed ED precautions with pt.  Will forward information to Dr [phone] and his RN for review and further recommendation.  Pt verbalizes understanding and agrees with current plan.

## 2021-02-19 ENCOUNTER — Other Ambulatory Visit: Payer: Self-pay | Admitting: Internal Medicine

## 2021-03-08 ENCOUNTER — Other Ambulatory Visit: Payer: Self-pay | Admitting: Internal Medicine

## 2021-04-03 ENCOUNTER — Ambulatory Visit (INDEPENDENT_AMBULATORY_CARE_PROVIDER_SITE_OTHER): Payer: Medicare Other | Admitting: Internal Medicine

## 2021-04-03 ENCOUNTER — Other Ambulatory Visit: Payer: Self-pay

## 2021-04-03 ENCOUNTER — Encounter: Payer: Self-pay | Admitting: *Deleted

## 2021-04-03 VITALS — BP 126/76 | HR 53 | Ht 74.0 in | Wt 240.2 lb

## 2021-04-03 DIAGNOSIS — G4733 Obstructive sleep apnea (adult) (pediatric): Secondary | ICD-10-CM | POA: Diagnosis not present

## 2021-04-03 DIAGNOSIS — I480 Paroxysmal atrial fibrillation: Secondary | ICD-10-CM | POA: Diagnosis not present

## 2021-04-03 NOTE — Progress Notes (Signed)
PCP: Ian Rabon, MD   Primary EP: Dr Ian Matthews More is a 70 y.o. male who presents today for routine electrophysiology followup.  Since last being seen in our clinic, the patient reports doing reasonably well.  His afib has returned.  + palpitations and fatigue.  Confirmed by KardiaMobile.  He is unaware of triggers.  Today, he denies symptoms of palpitations, chest pain, shortness of breath,  lower extremity edema, dizziness, presyncope, or syncope.  The patient is otherwise without complaint today.   Past Medical History:  Diagnosis Date   Calcaneal spur    Hypercholesterolemia    Hypogonadism in male    Hypothyroidism    Neuropathy    Obesity (BMI 35.0-39.9 without comorbidity)    Obstructive sleep apnea    s/p septoplasty   Paroxysmal atrial fibrillation (HCC)    Vitamin D deficiency    Past Surgical History:  Procedure Laterality Date   ATRIAL FIBRILLATION ABLATION N/A 10/17/2018   Procedure: ATRIAL FIBRILLATION ABLATION;  Surgeon: Ian Range, MD;  Location: [facility];  Service: Cardiovascular;  Laterality: N/A;   HEMORRHOID SURGERY     SEPTOPLASTY     TONSILLECTOMY      ROS- all systems are reviewed and negatives except as per HPI above  Current Outpatient Medications  Medication Sig Dispense Refill   aspirin-acetaminophen-caffeine (EXCEDRIN MIGRAINE) 250-250-65 MG tablet Take 2 tablets by mouth daily as needed for headache.     doxycycline (VIBRAMYCIN) 100 MG capsule Take 100 mg by mouth as needed for rash.     ergocalciferol (VITAMIN D2) 1.25 MG (50000 UT) capsule Take 50,000 Units by mouth every Thursday.      levothyroxine (SYNTHROID, LEVOTHROID) 100 MCG tablet Take 100 mcg by mouth at bedtime.      meloxicam (MOBIC) 15 MG tablet Take 1 tablet by mouth daily as needed.     metoprolol tartrate (LOPRESSOR) 25 MG tablet TAKE 1 TABLET (25 MG TOTAL) BY MOUTH EVERY 6 (SIX) HOURS AS NEEDED (FOR PALPITATIONS). 60 tablet 3   rosuvastatin (CRESTOR) 5 MG  tablet Take 5 mg by mouth at bedtime.      No current facility-administered medications for this visit.    Physical Exam: Vitals:   04/03/21 1101  BP: 126/76  Pulse: (!) 53  SpO2: 97%  Weight: 240 lb 3.2 oz (109 kg)  Height: 6\' 2"  (1.88 m)    GEN- The patient is well appearing, alert and oriented x 3 today.   Head- normocephalic, atraumatic Eyes-  Sclera clear, conjunctiva pink Ears- hearing intact Oropharynx- clear Lungs- Clear to ausculation bilaterally, normal work of breathing Heart- Regular rate and rhythm, no murmurs, rubs or gallops, PMI not laterally displaced GI- soft, NT, ND, + BS Extremities- no clubbing, cyanosis, or edema  Wt Readings from Last 3 Encounters:  04/03/21 240 lb 3.2 oz (109 kg)  01/26/21 240 lb 12.8 oz (109.2 kg)  09/25/19 226 lb (102.5 kg)    EKG tracing ordered today is personally reviewed and shows sinus bradycardia  Assessment and Plan:  Paroxysmal atrial fibrillation Chads2vasc score is 1.   The patient has symptomatic, recurrent  atrial fibrillation. We will start eliquis today. Given increased frequency of afib, I would advise repeat ablation or AAD therapy Therapeutic strategies for afib including medicine and ablation were discussed in detail with the patient today. Risk, benefits, and alternatives to EP study and radiofrequency ablation for afib were also discussed in detail today. These risks include but are not limited  to stroke, bleeding, vascular damage, tamponade, perforation, damage to the esophagus, lungs, and other structures, pulmonary vein stenosis, worsening renal function, and death. The patient understands these risk and wishes to proceed.  We will therefore proceed with catheter ablation at the next available time.  Carto, ICE, anesthesia are requested for the procedure.  Will also obtain cardiac CT prior to the procedure to exclude LAA thrombus and further evaluate atrial anatomy. We will obtain an echo to evaluate for  structural changes.  2. OSA S/p septoplasty  Risks, benefits and potential toxicities for medications prescribed and/or refilled reviewed with patient today.     Ian Range MD, [facility] 04/03/2021 11:34 AM

## 2021-04-03 NOTE — Patient Instructions (Signed)
Medication Instructions:  Start Eliquis 5 mg two times a day  Your physician recommends that you continue on your current medications as directed. Please refer to the Current Medication list given to you today.  Labwork: None ordered.  Testing/Procedures: Your physician has requested that you have an echocardiogram. Echocardiography is a painless test that uses sound waves to create images of your heart. It provides your doctor with information about the size and shape of your heart and how well your heart's chambers and valves are working. This procedure takes approximately one hour. There are no restrictions for this procedure.  Your physician has requested that you have cardiac CT. Cardiac computed tomography (CT) is a painless test that uses an x-ray machine to take clear, detailed pictures of your heart. For further information please visit [email or web address] Please follow instruction sheet as given. Your physician has recommended that you have an ablation. Catheter ablation is a medical procedure used to treat some cardiac arrhythmias (irregular heartbeats). During catheter ablation, a long, thin, flexible tube is put into a blood vessel in your groin (upper thigh), or neck. This tube is called an ablation catheter. It is then guided to your heart through the blood vessel. Radio frequency waves destroy small areas of heart tissue where abnormal heartbeats may cause an arrhythmia to start. Please see the instruction sheet given to you today.   Any Other Special Instructions Will Be Listed Below (If Applicable).  If you need a refill on your cardiac medications before your next appointment, please call your pharmacy.   Cardiac Ablation Cardiac ablation is a procedure to destroy (ablate) some heart tissue that is sending bad signals. These bad signals causeproblems in heart rhythm. The heart has many areas that make these signals. If there are problems in these areas, they can make the heart  beat in a way that is not normal.Destroying some tissues can help make the heart rhythm normal. Tell your doctor about: Any allergies you have. All medicines you are taking. These include vitamins, herbs, eye drops, creams, and over-the-counter medicines. Any problems you or family members have had with medicines that make you fall asleep (anesthetics). Any blood disorders you have. Any surgeries you have had. Any medical conditions you have, such as kidney failure. Whether you are pregnant or may be pregnant. What are the risks? This is a safe procedure. But problems may occur, including: Infection. Bruising and bleeding. Bleeding into the chest. Stroke or blood clots. Damage to nearby areas of your body. Allergies to medicines or dyes. The need for a pacemaker if the normal system is damaged. Failure of the procedure to treat the problem. What happens before the procedure? Medicines Ask your doctor about: Changing or stopping your normal medicines. This is important. Taking aspirin and ibuprofen. Do not take these medicines unless your doctor tells you to take them. Taking other medicines, vitamins, herbs, and supplements. General instructions Follow instructions from your doctor about what you cannot eat or drink. Plan to have someone take you home from the hospital or clinic. If you will be going home right after the procedure, plan to have someone with you for 24 hours. Ask your doctor what steps will be taken to prevent infection. What happens during the procedure?  An IV tube will be put into one of your veins. You will be given a medicine to help you relax. The skin on your neck or groin will be numbed. A cut (incision) will be made in your neck or groin.  A needle will be put through your cut and into a large vein. A tube (catheter) will be put into the needle. The tube will be moved to your heart. Dye may be put through the tube. This helps your doctor see your  heart. Small devices (electrodes) on the tube will send out signals. A type of energy will be used to destroy some heart tissue. The tube will be taken out. Pressure will be held on your cut. This helps stop bleeding. A bandage will be put over your cut. The exact procedure may vary among doctors and hospitals. What happens after the procedure? You will be watched until you leave the hospital or clinic. This includes checking your heart rate, breathing rate, oxygen, and blood pressure. Your cut will be watched for bleeding. You will need to lie still for a few hours. Do not drive for 24 hours or as long as your doctor tells you. Summary Cardiac ablation is a procedure to destroy some heart tissue. This is done to treat heart rhythm problems. Tell your doctor about any medical conditions you may have. Tell him or her about all medicines you are taking to treat them. This is a safe procedure. But problems may occur. These include infection, bruising, bleeding, and damage to nearby areas of your body. Follow what your doctor tells you about food and drink. You may also be told to change or stop some of your medicines. After the procedure, do not drive for 24 hours or as long as your doctor tells you. This information is not intended to replace advice given to you by your health care provider. Make sure you discuss any questions you have with your healthcare provider. Document Revised: 06/28/2019 Document Reviewed: 06/28/2019 Elsevier Patient Education  2022 [employer].

## 2021-04-10 MED ORDER — APIXABAN 5 MG PO TABS: 5.0000 mg | ORAL_TABLET | Freq: Two times a day (BID) | ORAL | 11 refills | Status: DC

## 2021-04-10 NOTE — Telephone Encounter (Signed)
Called patient with pharmacist advisement. Updated patient's medication list and advised patient not to take mobic or Excedrin while on eliquis.

## 2021-04-23 ENCOUNTER — Other Ambulatory Visit: Payer: Self-pay

## 2021-04-23 ENCOUNTER — Other Ambulatory Visit: Payer: Medicare Other

## 2021-04-23 DIAGNOSIS — G4733 Obstructive sleep apnea (adult) (pediatric): Secondary | ICD-10-CM

## 2021-04-23 DIAGNOSIS — I480 Paroxysmal atrial fibrillation: Secondary | ICD-10-CM

## 2021-04-23 LAB — BASIC METABOLIC PANEL
BUN/Creatinine Ratio: 13 (ref 10–24)
BUN: 15 mg/dL (ref 8–27)
CO2: 24 mmol/L (ref 20–29)
Calcium: 9.4 mg/dL (ref 8.6–10.2)
Chloride: 100 mmol/L (ref 96–106)
Creatinine, Ser: 1.12 mg/dL (ref 0.76–1.27)
Glucose: 101 mg/dL — ABNORMAL HIGH (ref 65–99)
Potassium: 5.1 mmol/L (ref 3.5–5.2)
Sodium: 139 mmol/L (ref 134–144)
eGFR: 70 mL/min/{1.73_m2} (ref >59)

## 2021-04-23 LAB — CBC
Hematocrit: 43.4 % (ref 37.5–51.0)
Hemoglobin: 14.7 g/dL (ref 13.0–17.7)
MCH: 32.6 pg (ref 26.6–33.0)
MCHC: 33.9 g/dL (ref 31.5–35.7)
MCV: 96 fL (ref 79–97)
Platelets: 268 10*3/uL (ref 150–450)
RBC: 4.51 x10E6/uL (ref 4.14–5.80)
RDW: 13.1 % (ref 11.6–15.4)
WBC: 6 10*3/uL (ref 3.4–10.8)

## 2021-04-30 ENCOUNTER — Other Ambulatory Visit (HOSPITAL_COMMUNITY): Payer: Medicare Other

## 2021-05-04 ENCOUNTER — Ambulatory Visit (INDEPENDENT_AMBULATORY_CARE_PROVIDER_SITE_OTHER): Payer: Medicare Other

## 2021-05-04 ENCOUNTER — Other Ambulatory Visit: Payer: Self-pay

## 2021-05-04 DIAGNOSIS — I480 Paroxysmal atrial fibrillation: Secondary | ICD-10-CM | POA: Diagnosis not present

## 2021-05-04 DIAGNOSIS — G4733 Obstructive sleep apnea (adult) (pediatric): Secondary | ICD-10-CM | POA: Diagnosis not present

## 2021-05-04 LAB — ECHOCARDIOGRAM COMPLETE
Area-P 1/2: 2.61 cm2
Calc EF: 52 %
S' Lateral: 3.57 cm
Single Plane A2C EF: 54.8 %
Single Plane A4C EF: 50.2 %

## 2021-05-06 ENCOUNTER — Telehealth (HOSPITAL_COMMUNITY): Payer: Self-pay | Admitting: *Deleted

## 2021-05-06 NOTE — Telephone Encounter (Signed)
Attempted to call patient regarding upcoming cardiac CT appointment. °Left message on voicemail with name and callback number ° °Jennalee Greaves RN Navigator Cardiac Imaging °[facility] °[phone] Office °[phone] Cell ° °

## 2021-05-07 ENCOUNTER — Other Ambulatory Visit: Payer: Self-pay

## 2021-05-07 ENCOUNTER — Ambulatory Visit (HOSPITAL_COMMUNITY)
Admission: RE | Admit: 2021-05-07 | Discharge: 2021-05-07 | Disposition: A | Discharge status: Home / Self Care | Payer: Medicare Other | Source: Ambulatory Visit | Attending: Internal Medicine | Admitting: Internal Medicine

## 2021-05-07 ENCOUNTER — Encounter (HOSPITAL_COMMUNITY): Payer: Self-pay

## 2021-05-07 DIAGNOSIS — I480 Paroxysmal atrial fibrillation: Secondary | ICD-10-CM | POA: Diagnosis present

## 2021-05-07 MED ORDER — IOHEXOL 350 MG/ML SOLN
95.0000 mL | Freq: Once | INTRAVENOUS | Status: AC | PRN
  Administered 2021-05-07: 95 mL via INTRAVENOUS

## 2021-05-11 NOTE — Pre-Procedure Instructions (Signed)
Instructed patient on the following items: Arrival time 1130 Nothing to eat or drink after midnight No meds AM of procedure Responsible person to drive you home and stay with you for 24 hrs  Have you missed any doses of anti-coagulant Eliquis- hasn't missed any doses    

## 2021-05-12 ENCOUNTER — Encounter (HOSPITAL_COMMUNITY): Payer: Self-pay | Admitting: Internal Medicine

## 2021-05-12 ENCOUNTER — Encounter (HOSPITAL_COMMUNITY)
Admission: RE | Disposition: A | Discharge status: Home / Self Care | Payer: Self-pay | Source: Ambulatory Visit | Attending: Internal Medicine

## 2021-05-12 ENCOUNTER — Ambulatory Visit (HOSPITAL_COMMUNITY)
Admission: RE | Admit: 2021-05-12 | Discharge: 2021-05-12 | Disposition: A | Discharge status: Home / Self Care | Payer: Medicare Other | Source: Ambulatory Visit | Attending: Internal Medicine | Admitting: Internal Medicine

## 2021-05-12 ENCOUNTER — Ambulatory Visit (HOSPITAL_COMMUNITY): Payer: Medicare Other | Admitting: Certified Registered"

## 2021-05-12 ENCOUNTER — Other Ambulatory Visit: Payer: Self-pay

## 2021-05-12 DIAGNOSIS — Z79899 Other long term (current) drug therapy: Secondary | ICD-10-CM | POA: Diagnosis not present

## 2021-05-12 DIAGNOSIS — I480 Paroxysmal atrial fibrillation: Secondary | ICD-10-CM | POA: Diagnosis present

## 2021-05-12 DIAGNOSIS — Z79890 Hormone replacement therapy (postmenopausal): Secondary | ICD-10-CM | POA: Insufficient documentation

## 2021-05-12 HISTORY — PX: ATRIAL FIBRILLATION ABLATION: EP1191

## 2021-05-12 LAB — POCT ACTIVATED CLOTTING TIME: Activated Clotting Time: 271 seconds

## 2021-05-12 SURGERY — ATRIAL FIBRILLATION ABLATION
Anesthesia: General

## 2021-05-12 MED ORDER — LIDOCAINE 2% (20 MG/ML) 5 ML SYRINGE
INTRAMUSCULAR | Status: DC | PRN
  Administered 2021-05-12: 60 mg via INTRAVENOUS

## 2021-05-12 MED ORDER — PROTAMINE SULFATE 10 MG/ML IV SOLN
INTRAVENOUS | Status: DC | PRN
  Administered 2021-05-12: 30 mg via INTRAVENOUS

## 2021-05-12 MED ORDER — HYDROCODONE-ACETAMINOPHEN 5-325 MG PO TABS: 1.0000 | ORAL_TABLET | ORAL | Status: DC | PRN

## 2021-05-12 MED ORDER — ISOPROTERENOL HCL 0.2 MG/ML IJ SOLN
INTRAVENOUS | Status: DC | PRN
  Administered 2021-05-12: 20 ug/min via INTRAVENOUS

## 2021-05-12 MED ORDER — FENTANYL CITRATE (PF) 100 MCG/2ML IJ SOLN
INTRAMUSCULAR | Status: DC | PRN
  Administered 2021-05-12 (×2): 50 ug via INTRAVENOUS

## 2021-05-12 MED ORDER — SODIUM CHLORIDE 0.9% FLUSH: 3.0000 mL | INTRAVENOUS | Status: DC | PRN

## 2021-05-12 MED ORDER — ONDANSETRON HCL 4 MG/2ML IJ SOLN
INTRAMUSCULAR | Status: DC | PRN
  Administered 2021-05-12: 4 mg via INTRAVENOUS

## 2021-05-12 MED ORDER — SODIUM CHLORIDE 0.9% FLUSH: 3.0000 mL | Freq: Two times a day (BID) | INTRAVENOUS | Status: DC

## 2021-05-12 MED ORDER — ADENOSINE 6 MG/2ML IV SOLN
INTRAVENOUS | Status: AC
  Filled 2021-05-12: qty 4

## 2021-05-12 MED ORDER — SUGAMMADEX SODIUM 200 MG/2ML IV SOLN
INTRAVENOUS | Status: DC | PRN
  Administered 2021-05-12: 200 mg via INTRAVENOUS

## 2021-05-12 MED ORDER — PANTOPRAZOLE SODIUM 40 MG PO TBEC: 40.0000 mg | DELAYED_RELEASE_TABLET | Freq: Every day | ORAL | 0 refills | Status: DC

## 2021-05-12 MED ORDER — PROPOFOL 10 MG/ML IV BOLUS
INTRAVENOUS | Status: DC | PRN
  Administered 2021-05-12: 160 mg via INTRAVENOUS

## 2021-05-12 MED ORDER — ACETAMINOPHEN 325 MG PO TABS
650.0000 mg | ORAL_TABLET | ORAL | Status: DC | PRN
  Filled 2021-05-12: qty 2

## 2021-05-12 MED ORDER — ONDANSETRON HCL 4 MG/2ML IJ SOLN: 4.0000 mg | Freq: Four times a day (QID) | INTRAMUSCULAR | Status: DC | PRN

## 2021-05-12 MED ORDER — SODIUM CHLORIDE 0.9 % IV SOLN: INTRAVENOUS | Status: DC

## 2021-05-12 MED ORDER — HEPARIN SODIUM (PORCINE) 1000 UNIT/ML IJ SOLN
INTRAMUSCULAR | Status: DC | PRN
  Administered 2021-05-12: 1000 [IU] via INTRAVENOUS

## 2021-05-12 MED ORDER — HEPARIN SODIUM (PORCINE) 1000 UNIT/ML IJ SOLN
INTRAMUSCULAR | Status: DC | PRN
  Administered 2021-05-12: 15000 [IU] via INTRAVENOUS

## 2021-05-12 MED ORDER — HEPARIN (PORCINE) IN NACL 2000-0.9 UNIT/L-% IV SOLN
INTRAVENOUS | Status: DC | PRN
  Administered 2021-05-12: 1000 mL

## 2021-05-12 MED ORDER — DEXAMETHASONE SODIUM PHOSPHATE 10 MG/ML IJ SOLN
INTRAMUSCULAR | Status: DC | PRN
  Administered 2021-05-12: 10 mg via INTRAVENOUS

## 2021-05-12 MED ORDER — ADENOSINE 6 MG/2ML IV SOLN
INTRAVENOUS | Status: DC | PRN
  Administered 2021-05-12 (×2): 12 mg via INTRAVENOUS

## 2021-05-12 MED ORDER — PHENYLEPHRINE HCL-NACL 20-0.9 MG/250ML-% IV SOLN
INTRAVENOUS | Status: DC | PRN
  Administered 2021-05-12: 25 ug/min via INTRAVENOUS

## 2021-05-12 MED ORDER — HEPARIN SODIUM (PORCINE) 1000 UNIT/ML IJ SOLN
INTRAMUSCULAR | Status: AC
  Filled 2021-05-12: qty 2

## 2021-05-12 MED ORDER — APIXABAN 5 MG PO TABS
5.0000 mg | ORAL_TABLET | Freq: Once | ORAL | Status: AC
  Administered 2021-05-12: 5 mg via ORAL
  Filled 2021-05-12: qty 1

## 2021-05-12 MED ORDER — ISOPROTERENOL HCL 0.2 MG/ML IJ SOLN
INTRAMUSCULAR | Status: AC
  Filled 2021-05-12: qty 5

## 2021-05-12 MED ORDER — HEPARIN SODIUM (PORCINE) 1000 UNIT/ML IJ SOLN
INTRAMUSCULAR | Status: DC | PRN
  Administered 2021-05-12: 4000 [IU] via INTRAVENOUS

## 2021-05-12 MED ORDER — ROCURONIUM BROMIDE 10 MG/ML (PF) SYRINGE
PREFILLED_SYRINGE | INTRAVENOUS | Status: DC | PRN
Start: 2021-05-12 — End: 2021-05-12
  Administered 2021-05-12: 50 mg via INTRAVENOUS
  Administered 2021-05-12: 20 mg via INTRAVENOUS

## 2021-05-12 MED ORDER — SODIUM CHLORIDE 0.9 % IV SOLN: 250.0000 mL | INTRAVENOUS | Status: DC | PRN

## 2021-05-12 SURGICAL SUPPLY — 16 items
CATH OCTARAY 2.0 F 3-3-3-3-3 (CATHETERS) ×2 IMPLANT
CATH SMTCH THERMOCOOL SF DF (CATHETERS) ×2 IMPLANT
CATH SOUNDSTAR ECO 8FR (CATHETERS) ×2 IMPLANT
CATH WEBSTER BI DIR CS D-F CRV (CATHETERS) ×2 IMPLANT
CLOSURE PERCLOSE PROSTYLE (VASCULAR PRODUCTS) ×6 IMPLANT
COVER SWIFTLINK CONNECTOR (BAG) ×2 IMPLANT
NEEDLE BAYLIS TRANSSEPTAL 71CM (NEEDLE) ×2 IMPLANT
PACK EP LATEX FREE (CUSTOM PROCEDURE TRAY) ×2
PACK EP LF (CUSTOM PROCEDURE TRAY) ×1 IMPLANT
PAD PRO RADIOLUCENT 2001M-C (PAD) ×2 IMPLANT
PATCH CARTO3 (PAD) ×2 IMPLANT
SHEATH BAYLIS TORFLEX (SHEATH) ×2 IMPLANT
SHEATH PINNACLE 7F 10CM (SHEATH) ×4 IMPLANT
SHEATH PINNACLE 9F 10CM (SHEATH) ×2 IMPLANT
SHEATH PROBE COVER 6X72 (BAG) ×2 IMPLANT
TUBING SMART ABLATE COOLFLOW (TUBING) ×2 IMPLANT

## 2021-05-12 NOTE — Anesthesia Procedure Notes (Signed)
Procedure Name: Intubation Date/Time: 05/12/2021 7:42 AM Performed by: Lavell Luster, CRNA Pre-anesthesia Checklist: Patient identified, Emergency Drugs available, Suction available, Patient being monitored and Timeout performed Patient Re-evaluated:Patient Re-evaluated prior to induction Oxygen Delivery Method: Circle system utilized Preoxygenation: Pre-oxygenation with 100% oxygen Induction Type: IV induction Ventilation: Oral airway inserted - appropriate to patient size and Mask ventilation without difficulty Laryngoscope Size: Mac, 4 and Glidescope Grade View: Grade II Tube type: Oral Tube size: 7.5 mm Number of attempts: 1 Airway Equipment and Method: Stylet Placement Confirmation: ETT inserted through vocal cords under direct vision, positive ETCO2 and breath sounds checked- equal and bilateral Secured at: 22 cm Tube secured with: Tape Dental Injury: Teeth and Oropharynx as per pre-operative assessment

## 2021-05-12 NOTE — Transfer of Care (Signed)
Immediate Anesthesia Transfer of Care Note  Patient: Ian Matthews  Procedure(s) Performed: ATRIAL FIBRILLATION ABLATION  Patient Location: Cath Lab  Anesthesia Type:General  Level of Consciousness: awake, alert  and oriented  Airway & Oxygen Therapy: Patient Spontanous Breathing  Post-op Assessment: Post -op Vital signs reviewed and stable  Post vital signs: stable  Last Vitals:  Vitals Value Taken Time  BP    Temp    Pulse 60 05/12/21 0945  Resp 15 05/12/21 0945  SpO2 98 % 05/12/21 0945  Vitals shown include unvalidated device data.  Last Pain: There were no vitals filed for this visit.       Complications: No notable events documented.

## 2021-05-12 NOTE — Anesthesia Postprocedure Evaluation (Signed)
Anesthesia Post Note  Patient: Ian Matthews  Procedure(s) Performed: ATRIAL FIBRILLATION ABLATION     Patient location during evaluation: PACU Anesthesia Type: General Level of consciousness: awake and alert Pain management: pain level controlled Vital Signs Assessment: post-procedure vital signs reviewed and stable Respiratory status: spontaneous breathing, nonlabored ventilation, respiratory function stable and patient connected to nasal cannula oxygen Cardiovascular status: blood pressure returned to baseline and stable Postop Assessment: no apparent nausea or vomiting Anesthetic complications: no   No notable events documented.  Last Vitals:  Vitals:   05/12/21 1200 05/12/21 1300  BP: 113/80 122/81  Pulse: 68 71  Resp: 15 (!) 21  Temp:    SpO2: 95% 97%    Last Pain:  Vitals:   05/12/21 1036  PainSc: 0-No pain                 Kennieth Rad

## 2021-05-12 NOTE — H&P (Signed)
PCP: Romeo Rabon, MD   Primary EP: Dr Hermenia Fiscal Devlin is a 70 y.o. male who presents today for routine electrophysiology study and ablation of afib.  Since last being seen in our clinic, the patient reports doing reasonably well.  His afib has returned.  + palpitations and fatigue.  Confirmed by [employer].  He is unaware of triggers.  Today, he denies symptoms of palpitations, chest pain, shortness of breath,  lower extremity edema, dizziness, presyncope, or syncope.  The patient is otherwise without complaint today.        Past Medical History:  Diagnosis Date   Calcaneal spur     Hypercholesterolemia     Hypogonadism in male     Hypothyroidism     Neuropathy     Obesity (BMI 35.0-39.9 without comorbidity)     Obstructive sleep apnea      s/p septoplasty   Paroxysmal atrial fibrillation (HCC)     Vitamin D deficiency           Past Surgical History:  Procedure Laterality Date   ATRIAL FIBRILLATION ABLATION N/A 10/17/2018    Procedure: ATRIAL FIBRILLATION ABLATION;  Surgeon: [name], MD;  Location: [facility];  Service: Cardiovascular;  Laterality: N/A;   HEMORRHOID SURGERY       SEPTOPLASTY       TONSILLECTOMY          ROS- all systems are reviewed and negatives except as per HPI above         Current Outpatient Medications  Medication Sig Dispense Refill   aspirin-acetaminophen-caffeine (EXCEDRIN MIGRAINE) 250-250-65 MG tablet Take 2 tablets by mouth daily as needed for headache.       doxycycline (VIBRAMYCIN) 100 MG capsule Take 100 mg by mouth as needed for rash.       ergocalciferol (VITAMIN D2) 1.25 MG (50000 UT) capsule Take 50,000 Units by mouth every Thursday.        levothyroxine (SYNTHROID, LEVOTHROID) 100 MCG tablet Take 100 mcg by mouth at bedtime.        meloxicam (MOBIC) 15 MG tablet Take 1 tablet by mouth daily as needed.       metoprolol tartrate (LOPRESSOR) 25 MG tablet TAKE 1 TABLET (25 MG TOTAL) BY MOUTH EVERY 6 (SIX) HOURS AS  NEEDED (FOR PALPITATIONS). 60 tablet 3   rosuvastatin (CRESTOR) 5 MG tablet Take 5 mg by mouth at bedtime.         No current facility-administered medications for this visit.      Physical Exam: Vitals:   05/12/21 0500  BP: (!) 149/96  Pulse: (!) 58  Temp: 98 F (36.7 C)  SpO2: 100%    GEN- The patient is well appearing, alert and oriented x 3 today.   Head- normocephalic, atraumatic Eyes-  Sclera clear, conjunctiva pink Ears- hearing intact Oropharynx- clear Lungs- Clear to ausculation bilaterally, normal work of breathing Heart- Regular rate and rhythm, no murmurs, rubs or gallops, PMI not laterally displaced GI- soft, NT, ND, + BS Extremities- no clubbing, cyanosis, or edema      Wt Readings from Last 3 Encounters:  04/03/21 240 lb 3.2 oz (109 kg)  01/26/21 240 lb 12.8 oz (109.2 kg)  09/25/19 226 lb (102.5 kg)       Assessment and Plan:   Paroxysmal atrial fibrillation Chads2vasc score is 1.   The patient has symptomatic, recurrent  atrial fibrillation.   Risk, benefits, and alternatives to EP study and radiofrequency ablation for afib  were again discussed in detail today. These risks include but are not limited to stroke, bleeding, vascular damage, tamponade, perforation, damage to the esophagus, lungs, and other structures, pulmonary vein stenosis, worsening renal function, and death. The patient understands these risk and wishes to proceed.    Cardiac CT reviewed at length with the patient today.  he reports compliance with OAC without interruption.  [name] MD, [facility] 05/12/2021 7:18 AM

## 2021-05-12 NOTE — Progress Notes (Signed)
At 1330 when bedrest was completed, patient developed small hematoma at R groin site. Vital signs stable. Allred, MD notified and stated to extend bedrest until 1400. Patient and family updated. Will continue to monitor.

## 2021-05-12 NOTE — Anesthesia Preprocedure Evaluation (Signed)
Anesthesia Evaluation  Patient identified by MRN, date of birth, ID band Patient awake    Reviewed: Allergy & Precautions, NPO status , Patient's Chart, lab work & pertinent test results  Airway Mallampati: III  TM Distance: >3 FB Neck ROM: Full    Dental  (+) Dental Advisory Given   Pulmonary sleep apnea ,    breath sounds clear to auscultation       Cardiovascular hypertension, Pt. on home beta blockers and Pt. on medications + dysrhythmias Atrial Fibrillation  Rhythm:Regular Rate:Normal     Neuro/Psych negative neurological ROS     GI/Hepatic negative GI ROS, Neg liver ROS,   Endo/Other  Hypothyroidism   Renal/GU negative Renal ROS     Musculoskeletal   Abdominal   Peds  Hematology negative hematology ROS (+)   Anesthesia Other Findings   Reproductive/Obstetrics                             Anesthesia Physical Anesthesia Plan  ASA: 3  Anesthesia Plan: General   Post-op Pain Management:    Induction: Intravenous  PONV Risk Score and Plan: 2 and Dexamethasone, Ondansetron and Treatment may vary due to age or medical condition  Airway Management Planned: Oral ETT  Additional Equipment: None  Intra-op Plan:   Post-operative Plan: Extubation in OR  Informed Consent: I have reviewed the patients History and Physical, chart, labs and discussed the procedure including the risks, benefits and alternatives for the proposed anesthesia with the patient or authorized representative who has indicated his/her understanding and acceptance.     Dental advisory given  Plan Discussed with: CRNA  Anesthesia Plan Comments:         Anesthesia Quick Evaluation

## 2021-06-16 ENCOUNTER — Ambulatory Visit (HOSPITAL_COMMUNITY)
Admission: RE | Admit: 2021-06-16 | Discharge: 2021-06-16 | Disposition: A | Discharge status: Home / Self Care | Payer: Medicare Other | Source: Ambulatory Visit | Attending: Nurse Practitioner | Admitting: Nurse Practitioner

## 2021-06-16 VITALS — BP 140/94 | HR 70 | Ht 74.0 in | Wt 234.0 lb

## 2021-06-16 DIAGNOSIS — Z7901 Long term (current) use of anticoagulants: Secondary | ICD-10-CM | POA: Diagnosis not present

## 2021-06-16 DIAGNOSIS — I480 Paroxysmal atrial fibrillation: Secondary | ICD-10-CM | POA: Insufficient documentation

## 2021-06-16 DIAGNOSIS — D6869 Other thrombophilia: Secondary | ICD-10-CM | POA: Diagnosis not present

## 2021-06-16 DIAGNOSIS — Z79899 Other long term (current) drug therapy: Secondary | ICD-10-CM | POA: Insufficient documentation

## 2021-06-16 NOTE — Progress Notes (Signed)
Primary Care Physician: Romeo Rabon, MD Referring Physician: Dr. Sammuel Hines Deshotel is a 70 y.o. male with a h/o afib that underwent his second afib ablation one month ago. Has a few short lived episodes of afib since then. He is in SR today and is back to his usual activities. Nos swallowing or groin concerns,    Today, he denies symptoms of palpitations, chest pain, shortness of breath, orthopnea, PND, lower extremity edema, dizziness, presyncope, syncope, or neurologic sequela. The patient is tolerating medications without difficulties and is otherwise without complaint today.   Past Medical History:  Diagnosis Date   Calcaneal spur    Hypercholesterolemia    Hypogonadism in male    Hypothyroidism    Neuropathy    Obesity (BMI 35.0-39.9 without comorbidity)    Obstructive sleep apnea    s/p septoplasty   Paroxysmal atrial fibrillation (HCC)    Vitamin D deficiency    Past Surgical History:  Procedure Laterality Date   ATRIAL FIBRILLATION ABLATION N/A 10/17/2018   Procedure: ATRIAL FIBRILLATION ABLATION;  Surgeon: [name], MD;  Location: [facility];  Service: Cardiovascular;  Laterality: N/A;   ATRIAL FIBRILLATION ABLATION N/A 05/12/2021   Procedure: ATRIAL FIBRILLATION ABLATION;  Surgeon: [name], MD;  Location: [facility];  Service: Cardiovascular;  Laterality: N/A;   HEMORRHOID SURGERY     SEPTOPLASTY     TONSILLECTOMY      Current Outpatient Medications  Medication Sig Dispense Refill   acetaminophen (TYLENOL) 500 MG tablet Take 1,000 mg by mouth as needed for moderate pain.     apixaban (ELIQUIS) 5 MG TABS tablet Take 1 tablet (5 mg total) by mouth 2 (two) times daily. 60 tablet 11   doxycycline (VIBRAMYCIN) 100 MG capsule Take 100 mg by mouth See admin instructions. Take 100 mg 4 times weekly, may take 100 mg daily as needed during a rosacea flare     ergocalciferol (VITAMIN D2) 1.25 MG (50000 UT) capsule Take 50,000 Units by mouth  every Friday.     levothyroxine (SYNTHROID, LEVOTHROID) 100 MCG tablet Take 100 mcg by mouth at bedtime.      metoprolol tartrate (LOPRESSOR) 25 MG tablet TAKE 1 TABLET (25 MG TOTAL) BY MOUTH EVERY 6 (SIX) HOURS AS NEEDED (FOR PALPITATIONS). 60 tablet 3   pantoprazole (PROTONIX) 40 MG tablet Take 1 tablet (40 mg total) by mouth daily. 45 tablet 0   rosuvastatin (CRESTOR) 5 MG tablet Take 5 mg by mouth at bedtime.      tamsulosin (FLOMAX) 0.4 MG CAPS capsule Take 0.4 mg by mouth daily. Not started medication yet.     meloxicam (MOBIC) 15 MG tablet Take 1 tablet by mouth daily as needed for pain. (Patient not taking: Reported on 06/16/2021)     No current facility-administered medications for this encounter.    No Known Allergies  Social History   Socioeconomic History   Marital status: Married    Spouse name: Not on file   Number of children: Not on file   Years of education: Not on file   Highest education level: Not on file  Occupational History   Not on file  Tobacco Use   Smoking status: Never   Smokeless tobacco: Never  Vaping Use   Vaping Use: Never used  Substance and Sexual Activity   Alcohol use: Not Currently   Drug use: Never   Sexual activity: Not on file  Other Topics Concern   Not on  file  Social History Narrative   Lives in [place]   [place]   Social Determinants of Health   Financial Resource Strain: Not on file  Food Insecurity: Not on file  Transportation Needs: Not on file  Physical Activity: Not on file  Stress: Not on file  Social Connections: Not on file  Intimate Partner Violence: Not on file    Family History  Problem Relation Age of Onset   Cancer Father        THROAT   Diabetes Father    Alzheimer's disease Mother    Atrial fibrillation Mother    Atrial fibrillation Sister     ROS- All systems are reviewed and negative except as per the HPI above  Physical Exam: Vitals:   06/16/21 1055  BP: (!) 140/94  Pulse: 70  Weight:  106.1 kg  Height: 6\' 2"  (1.88 m)   Wt Readings from Last 3 Encounters:  06/16/21 106.1 kg  05/12/21 104.3 kg  04/03/21 109 kg    Labs: Lab Results  Component Value Date   NA 139 04/23/2021   K 5.1 04/23/2021   CL 100 04/23/2021   CO2 24 04/23/2021   GLUCOSE 101 (H) 04/23/2021   BUN 15 04/23/2021   CREATININE 1.12 04/23/2021   CALCIUM 9.4 04/23/2021   No results found for: INR No results found for: CHOL, HDL, LDLCALC, TRIG   GEN- The patient is well appearing, alert and oriented x 3 today.   Head- normocephalic, atraumatic Eyes-  Sclera clear, conjunctiva pink Ears- hearing intact Oropharynx- clear Neck- supple, no JVP Lymph- no cervical lymphadenopathy Lungs- Clear to ausculation bilaterally, normal work of breathing Heart- Regular rate and rhythm, no murmurs, rubs or gallops, PMI not laterally displaced GI- soft, NT, ND, + BS Extremities- no clubbing, cyanosis, or edema MS- no significant deformity or atrophy Skin- no rash or lesion Psych- euthymic mood, full affect Neuro- strength and sensation are intact  EKG-NSR at 70 bpm, pr int 178 ms, qrs int 90 ms, qtc 444 ms     Assessment and Plan:  1. Afib S/p ablation x 2 He is doing well staying in SR  Continue metoprolol  25 mg bid   2. CHA2DS2VASc  score of 1 Continue eliquis 5 mg bid without interruption   F/u with Dr. 04/25/2021 08/27/20  08/29/20. [facility] [address] [place], [place] [phone]

## 2021-06-19 ENCOUNTER — Other Ambulatory Visit: Payer: Self-pay | Admitting: *Deleted

## 2021-06-19 ENCOUNTER — Other Ambulatory Visit: Payer: Self-pay | Admitting: Internal Medicine

## 2021-06-19 NOTE — Telephone Encounter (Signed)
Pt's pharmacy is requesting a refill on pantoprazole. Would Dr. Allred like to refill this medication? Please address 

## 2021-08-13 ENCOUNTER — Ambulatory Visit (INDEPENDENT_AMBULATORY_CARE_PROVIDER_SITE_OTHER): Payer: BLUE CROSS/BLUE SHIELD | Admitting: Internal Medicine

## 2021-08-13 ENCOUNTER — Other Ambulatory Visit: Payer: Self-pay

## 2021-08-13 ENCOUNTER — Encounter: Payer: Self-pay | Admitting: Internal Medicine

## 2021-08-13 VITALS — BP 108/70 | HR 70 | Ht 73.0 in | Wt 230.8 lb

## 2021-08-13 DIAGNOSIS — G4733 Obstructive sleep apnea (adult) (pediatric): Secondary | ICD-10-CM | POA: Diagnosis not present

## 2021-08-13 DIAGNOSIS — I480 Paroxysmal atrial fibrillation: Secondary | ICD-10-CM | POA: Diagnosis not present

## 2021-08-13 NOTE — Patient Instructions (Signed)
Medication Instructions:  Stop Eliquis Your physician recommends that you continue on your current medications as directed. Please refer to the Current Medication list given to you today. *If you need a refill on your cardiac medications before your next appointment, please call your pharmacy*  Lab Work: None. If you have labs (blood work) drawn today and your tests are completely normal, you will receive your results only by: [place] (if you have MyChart) OR A paper copy in the mail If you have any lab test that is abnormal or we need to change your treatment, we will call you to review the results.  Testing/Procedures: None.  Follow-Up: At [facility], you and your health needs are our priority.  As part of our continuing mission to provide you with exceptional heart care, we have created designated Provider Care Teams.  These Care Teams include your primary Cardiologist (physician) and Advanced Practice Providers (APPs -  Physician Assistants and Nurse Practitioners) who all work together to provide you with the care you need, when you need it.  Your physician wants you to follow-up in: 4 months with Ian Matthews     You will receive a reminder letter in the mail two months in advance. If you don't receive a letter, please call our office to schedule the follow-up appointment.  We recommend signing up for the patient portal called "MyChart".  Sign up information is provided on this After Visit Summary.  MyChart is used to connect with patients for Virtual Visits (Telemedicine).  Patients are able to view lab/test results, encounter notes, upcoming appointments, etc.  Non-urgent messages can be sent to your provider as well.   To learn more about what you can do with MyChart, go to [email or web address].    Any Other Special Instructions Will Be Listed Below (If Applicable).

## 2021-08-13 NOTE — Progress Notes (Signed)
PCP: Romeo Rabon, MD    Ian Matthews is a 71 y.o. male who presents today for routine electrophysiology followup.  Since his recent afib ablation, the patient reports doing very well.  he denies procedure related complications and is pleased with the results of the procedure.  Today, he denies symptoms of palpitations, chest pain, shortness of breath,  lower extremity edema, dizziness, presyncope, or syncope.  The patient is otherwise without complaint today.   Past Medical History:  Diagnosis Date   Calcaneal spur    Hypercholesterolemia    Hypogonadism in male    Hypothyroidism    Neuropathy    Obesity (BMI 35.0-39.9 without comorbidity)    Obstructive sleep apnea    s/p septoplasty   Paroxysmal atrial fibrillation (HCC)    Vitamin D deficiency    Past Surgical History:  Procedure Laterality Date   ATRIAL FIBRILLATION ABLATION N/A 10/17/2018   Procedure: ATRIAL FIBRILLATION ABLATION;  Surgeon: [name], MD;  [facility];  Service: Cardiovascular;  Laterality: N/A;   ATRIAL FIBRILLATION ABLATION N/A 05/12/2021   Procedure: ATRIAL FIBRILLATION ABLATION;  Surgeon: [name], MD;  Location: [facility];  Service: Cardiovascular;  Laterality: N/A;   HEMORRHOID SURGERY     SEPTOPLASTY     TONSILLECTOMY      ROS- all systems are personally reviewed and negatives except as per HPI above  Current Outpatient Medications  Medication Sig Dispense Refill   acetaminophen (TYLENOL) 500 MG tablet Take 1,000 mg by mouth as needed for moderate pain.     apixaban (ELIQUIS) 5 MG TABS tablet Take 1 tablet (5 mg total) by mouth 2 (two) times daily. 60 tablet 11   doxycycline (VIBRAMYCIN) 100 MG capsule Take 100 mg by mouth See admin instructions. Take 100 mg 4 times weekly, may take 100 mg daily as needed during a rosacea flare     ergocalciferol (VITAMIN D2) 1.25 MG (50000 UT) capsule Take 50,000 Units by mouth every Friday.     levothyroxine (SYNTHROID,  LEVOTHROID) 100 MCG tablet Take 100 mcg by mouth at bedtime.      meloxicam (MOBIC) 15 MG tablet Take 1 tablet by mouth daily as needed for pain.     metoprolol tartrate (LOPRESSOR) 25 MG tablet TAKE 1 TABLET (25 MG TOTAL) BY MOUTH EVERY 6 (SIX) HOURS AS NEEDED (FOR PALPITATIONS). 60 tablet 3   rosuvastatin (CRESTOR) 5 MG tablet Take 5 mg by mouth at bedtime.      tamsulosin (FLOMAX) 0.4 MG CAPS capsule Take 0.4 mg by mouth daily. Not started medication yet.     No current facility-administered medications for this visit.    Physical Exam: Vitals:   08/13/21 1100  BP: 108/70  Pulse: 70  SpO2: 96%  Weight: 230 lb 12.8 oz (104.7 kg)  Height: 6\' 1"  (1.854 m)    GEN- The patient is well appearing, alert and oriented x 3 today.   Head- normocephalic, atraumatic Eyes-  Sclera clear, conjunctiva pink Ears- hearing intact Oropharynx- clear Lungs- Clear to ausculation bilaterally, normal work of breathing Heart- Regular rate and rhythm, no murmurs, rubs or gallops, PMI not laterally displaced GI- soft, NT, ND, + BS Extremities- no clubbing, cyanosis, or edema  EKG tracing ordered today is personally reviewed and shows sinus  Assessment and Plan:  1. Paroxysmal atrial fibrillation Doing well s/p ablation chads2vasc score is 1. Stop eliquis   2. OSA S/p septoplasty   Return to see me in 4 months  [name] MD, [facility] 08/13/2021 11:01 AM

## 2021-08-27 ENCOUNTER — Ambulatory Visit: Payer: Medicare Other | Admitting: Internal Medicine

## 2021-12-30 ENCOUNTER — Encounter: Payer: Self-pay | Admitting: Internal Medicine

## 2021-12-30 ENCOUNTER — Ambulatory Visit (INDEPENDENT_AMBULATORY_CARE_PROVIDER_SITE_OTHER): Payer: BLUE CROSS/BLUE SHIELD | Admitting: Internal Medicine

## 2021-12-30 VITALS — BP 110/66 | HR 55 | Ht 73.0 in

## 2021-12-30 DIAGNOSIS — I480 Paroxysmal atrial fibrillation: Secondary | ICD-10-CM | POA: Diagnosis not present

## 2021-12-30 NOTE — Progress Notes (Signed)
   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 male who presents today for routine electrophysiology followup.  Since last being seen in our clinic, the patient reports doing very well.  Today, he denies symptoms of palpitations, chest pain, shortness of breath,  lower extremity edema, dizziness, presyncope, or syncope.  The patient is otherwise without complaint today.   Past Medical History:  Diagnosis Date   Calcaneal spur    Hypercholesterolemia    Hypogonadism in male    Hypothyroidism    Neuropathy    Obesity (BMI 35.0-39.9 without comorbidity)    Obstructive sleep apnea    s/p septoplasty   Paroxysmal atrial fibrillation (HCC)    Vitamin D deficiency    Past Surgical History:  Procedure Laterality Date   ATRIAL FIBRILLATION ABLATION N/A 10/17/2018   Procedure: ATRIAL FIBRILLATION ABLATION;  Surgeon: [name], MD;  [facility];  Service: Cardiovascular;  Laterality: N/A;   ATRIAL FIBRILLATION ABLATION N/A 05/12/2021   Procedure: ATRIAL FIBRILLATION ABLATION;  Surgeon: [name], MD;  [facility];  Service: Cardiovascular;  Laterality: N/A;   HEMORRHOID SURGERY     SEPTOPLASTY     TONSILLECTOMY      ROS- all systems are reviewed and negatives except as per HPI above  Current Outpatient Medications  Medication Sig Dispense Refill   acetaminophen (TYLENOL) 500 MG tablet Take 1,000 mg by mouth as needed for moderate pain.     doxycycline (VIBRAMYCIN) 100 MG capsule Take 100 mg by mouth See admin instructions. Take 100 mg 4 times weekly, may take 100 mg daily as needed during a rosacea flare     ergocalciferol (VITAMIN D2) 1.25 MG (50000 UT) capsule Take 50,000 Units by mouth every Friday.     levothyroxine (SYNTHROID, LEVOTHROID) 100 MCG tablet Take 100 mcg by mouth at bedtime.      meloxicam (MOBIC) 15 MG tablet Take 1 tablet by mouth daily as needed for pain.     metoprolol tartrate (LOPRESSOR) 25 MG  tablet TAKE 1 TABLET (25 MG TOTAL) BY MOUTH EVERY 6 (SIX) HOURS AS NEEDED (FOR PALPITATIONS). 60 tablet 3   tamsulosin (FLOMAX) 0.4 MG CAPS capsule Take 0.4 mg by mouth daily. Not started medication yet.     atorvastatin (LIPITOR) 10 MG tablet Take 10 mg by mouth every morning.     No current facility-administered medications for this visit.    Physical Exam: Vitals:   12/30/21 1109  BP: 110/66  Pulse: (!) 55  SpO2: 93%  Height: 6\' 1"  (1.854 m)    GEN- The patient is well appearing, alert and oriented x 3 today.   Head- normocephalic, atraumatic Eyes-  Sclera clear, conjunctiva pink Ears- hearing intact Oropharynx- clear Lungs- Clear to ausculation bilaterally, normal work of breathing Heart- Regular rate and rhythm, no murmurs, rubs or gallops, PMI not laterally displaced GI- soft, NT, ND, + BS Extremities- no clubbing, cyanosis, or edema  Wt Readings from Last 3 Encounters:  08/13/21 230 lb 12.8 oz (104.7 kg)  06/16/21 234 lb (106.1 kg)  05/12/21 230 lb (104.3 kg)    EKG tracing ordered today is personally reviewed and shows sinus bradycardia  Assessment and Plan:  Paroxysmal atrial fibrillation Well controlled post ablation Chads2vasc score is 1.  Does not require OAC  2. OSA S/p septoplasy  Return in 6 months  07/12/21 MD, [facility] 12/30/2021 11:30 AM

## 2021-12-30 NOTE — Patient Instructions (Signed)
Medication Instructions:  Your physician recommends that you continue on your current medications as directed. Please refer to the Current Medication list given to you today.  *If you need a refill on your cardiac medications before your next appointment, please call your pharmacy*   Lab Work: None ordered   Testing/Procedures: None ordered   Follow-Up: At [facility], you and your health needs are our priority.  As part of our continuing mission to provide you with exceptional heart care, we have created designated Provider Care Teams.  These Care Teams include your primary Cardiologist (physician) and Advanced Practice Providers (APPs -  Physician Assistants and Nurse Practitioners) who all work together to provide you with the care you need, when you need it.  Your next appointment:   6 month(s)  The format for your next appointment:   In Person  Provider:   James Allred, MD    Thank you for choosing [facility]!!   [phone]  Other Instructions   Important Information About Sugar         

## 2022-08-31 NOTE — Progress Notes (Deleted)
   PCP:  Moshe Cipro, MD Primary Cardiologist: None Electrophysiologist: None   Ian Matthews is a 72 y.o. male seen today for None for {VISITTYPE:28148}  Past Medical History:  Diagnosis Date   Calcaneal spur    Hypercholesterolemia    Hypogonadism in male    Hypothyroidism    Neuropathy    Obesity (BMI 35.0-39.9 without comorbidity)    Obstructive sleep apnea    s/p septoplasty   Paroxysmal atrial fibrillation (HCC)    Vitamin D deficiency     Current Outpatient Medications  Medication Instructions   acetaminophen (TYLENOL) 1,000 mg, Oral, As needed   atorvastatin (LIPITOR) 10 mg, Oral, Every morning   doxycycline (VIBRAMYCIN) 100 mg, Oral, See admin instructions, Take 100 mg 4 times weekly, may take 100 mg daily as needed during a rosacea flare   ergocalciferol (VITAMIN D2) 50,000 Units, Oral, Every Fri   levothyroxine (SYNTHROID) 100 mcg, Oral, Daily at bedtime   meloxicam (MOBIC) 15 MG tablet 1 tablet, Oral, Daily PRN   metoprolol tartrate (LOPRESSOR) 25 mg, Oral, Every 6 hours PRN   tamsulosin (FLOMAX) 0.4 mg, Oral, Daily, Not started medication yet.    Physical Exam: There were no vitals filed for this visit.  GEN- NAD. A&O x 3. Normal affect. HEENT: normocephalic, atraumatic Lungs- CTAB, Normal effort Heart- {EPRHYTHM:28826}, No M/G/R Extremities- {EDEMA LEVEL:28147::"No"} peripheral edema. no clubbing or cyanosis; Skin- warm and dry, no rash or lesion  {EKGtoday:28818::"EKG is not ordered today"}  Additional studies reviewed include: Previous EP notes.   {Select studies to display:26339}  Assessment and Plan:  1. Paroxysmal atrial fibrillation S/p Ablations 10/2018 and 05/2021 by Dr. Rayann Heman EKG today shows *** Overall well controlled Not on [facility] chronically with CHA2DS2/VASc of 1 (age) Consider [place] with any additional risk factors.   2. OSA S/p septoplasty  Follow up with {UVOZD:66440} {EPFOLLOW UP:28173}  Shirley Friar, PA-C   08/31/22 9:36 AM

## 2022-09-02 ENCOUNTER — Ambulatory Visit: Payer: BLUE CROSS/BLUE SHIELD | Admitting: Student

## 2022-09-02 DIAGNOSIS — I480 Paroxysmal atrial fibrillation: Secondary | ICD-10-CM

## 2022-09-02 DIAGNOSIS — G4733 Obstructive sleep apnea (adult) (pediatric): Secondary | ICD-10-CM

## 2022-09-16 ENCOUNTER — Encounter (HOSPITAL_COMMUNITY): Payer: Self-pay | Admitting: *Deleted

## 2022-09-19 NOTE — Progress Notes (Unsigned)
Cardiology Office Note Date:  09/20/2022  Patient ID:  Ian Matthews, DOB May 22, 1951, MRN [id number] PCP:  Moshe Cipro, MD  Electrophysiologist: Dr. Rayann Heman     Chief Complaint: overdue 6 mo  History of Present Illness: Ian Matthews is a 72 y.o. male with history of OSA (s/p septoplasty), obesity, HLD, hypothyroidism, AFib, rosacea   He saw Dr. Rayann Heman 12/30/21, doing well, no changes were made, with low risk score maintained off [place].  TODAY He is doing GREAT! He is a working Theme park manager, very active, walks his dog daily (about a mile). Hs good exertional capacity. No CP, palpitations or cardiac awareness. No SOB, DOE No near syncope or syncope.  No symptoms of AFib  No new medical problems or diagnosis', risk score remains one for age Sees his PMD regularly, has labs done there   Device information Diagnosed +/- 2015 Feb 2020 Flecainide (pill in the pocket) > stopped Feb 2021 PVI ablation 10/17/2018 PVI ablation 05/12/2021   Past Medical History:  Diagnosis Date   Calcaneal spur    Hypercholesterolemia    Hypogonadism in male    Hypothyroidism    Neuropathy    Obesity (BMI 35.0-39.9 without comorbidity)    Obstructive sleep apnea    s/p septoplasty   Paroxysmal atrial fibrillation ([place])    Vitamin D deficiency     Past Surgical History:  Procedure Laterality Date   ATRIAL FIBRILLATION ABLATION N/A 10/17/2018   Procedure: ATRIAL FIBRILLATION ABLATION;  Surgeon: Thompson Grayer, MD;  Location: [facility];  Service: Cardiovascular;  Laterality: N/A;   ATRIAL FIBRILLATION ABLATION N/A 05/12/2021   Procedure: ATRIAL FIBRILLATION ABLATION;  Surgeon: Thompson Grayer, MD;  Location: [facility];  Service: Cardiovascular;  Laterality: N/A;   HEMORRHOID SURGERY     SEPTOPLASTY     TONSILLECTOMY      Current Outpatient Medications  Medication Sig Dispense Refill   acetaminophen (TYLENOL) 500 MG tablet Take 1,000 mg by mouth as needed for moderate pain.      atorvastatin (LIPITOR) 10 MG tablet Take 10 mg by mouth every morning.     doxycycline (VIBRAMYCIN) 100 MG capsule Take 100 mg by mouth See admin instructions. Take 100 mg 3 times weekly, may take 100 mg daily as needed during a rosacea flare     ergocalciferol (VITAMIN D2) 1.25 MG (50000 UT) capsule Take 50,000 Units by mouth every Friday.     levothyroxine (SYNTHROID, LEVOTHROID) 100 MCG tablet Take 100 mcg by mouth at bedtime.      meloxicam (MOBIC) 15 MG tablet Take 1 tablet by mouth daily as needed for pain.     metoprolol tartrate (LOPRESSOR) 25 MG tablet TAKE 1 TABLET (25 MG TOTAL) BY MOUTH EVERY 6 (SIX) HOURS AS NEEDED (FOR PALPITATIONS). 60 tablet 3   tamsulosin (FLOMAX) 0.4 MG CAPS capsule Take 0.4 mg by mouth daily. Not started medication yet.     No current facility-administered medications for this visit.    Allergies:   Patient has no known allergies.   Social History:  The patient  reports that he has never smoked. He has never used smokeless tobacco. He reports that he does not currently use alcohol. He reports that he does not use drugs.   Family History:  The patient's family history includes Alzheimer's disease in his mother; Atrial fibrillation in his mother and sister; Cancer in his father; Diabetes in his father.  ROS:  Please see the history of present illness.  All other systems are reviewed and otherwise negative.   PHYSICAL EXAM:  VS:  BP 120/86   Pulse 80   Ht 6' 2"$  (1.88 m)   Wt 232 lb (105.2 kg)   SpO2 97%   BMI 29.79 kg/m  BMI: Body mass index is 29.79 kg/m. Well nourished, well developed, in no acute distress HEENT: normocephalic, atraumatic Neck: no JVD, carotid bruits or masses Cardiac:   RRR; no significant murmurs, no rubs, or gallops Lungs:   CTA b/l, no wheezing, rhonchi or rales Abd: soft, nontender MS: no deformity or  atrophy Ext:  no edema Skin: warm and dry, no rash Neuro:  No gross deficits appreciated Psych: euthymic mood, full  affect   EKG:  not done today 12/29/21 personally reviewed, SB 55  05/12/2021: EPS/ablation CONCLUSIONS: 1. Sinus rhythm upon presentation.   2. Intracardiac echo reveals a moderate sized left atrium with four separate pulmonary veins without evidence of pulmonary vein stenosis. 3. Return of electrical activity along the anterior carina of the left pulmonary veins as well as along the posterior portion of the right pulmonary veins at baseline.  4. Successful sequential electrical reisolation of all four pulmonary veins.  Persistent isolation was observed with Adenosine administration 5. Additional ablation performed at the SVC-RA junction after additional spontaneous firing was observed in this region during isuprel infusion 6. No early apparent complications.   05/04/21: TTE 1. Left ventricular ejection fraction, by estimation, is 50 to 55%. The  left ventricle has low normal function. The left ventricle demonstrates  global hypokinesis. Left ventricular diastolic parameters are consistent  with Grade I diastolic dysfunction  (impaired relaxation).   2. Right ventricular systolic function is normal. The right ventricular  size is mildly enlarged.   3. The mitral valve is normal in structure. Mild mitral valve  regurgitation. No evidence of mitral stenosis.   4. The aortic valve is normal in structure. Aortic valve regurgitation is  not visualized. No aortic stenosis is present.   5. The inferior vena cava is normal in size with greater than 50%  respiratory variability, suggesting right atrial pressure of 3 mmHg.   Comparison(s): EF 60%.     10/17/2018; EPS/ablation CONCLUSIONS: 1. Sinus rhythm upon presentation.   2. Intracardiac echo reveals a moderate sized left atrium with four separate pulmonary veins without evidence of pulmonary vein stenosis. 3. Successful electrical isolation and anatomical encircling of all four pulmonary veins with radiofrequency current. 4. No  inducible arrhythmias following ablation both on and off of Isuprel 5. No early apparent complications.    Recent Labs: No results found for requested labs within last 365 days.  No results found for requested labs within last 365 days.   CrCl cannot be calculated (Patient's most recent lab result is older than the maximum 21 days allowed.).   Wt Readings from Last 3 Encounters:  09/20/22 232 lb (105.2 kg)  08/13/21 230 lb 12.8 oz (104.7 kg)  06/16/21 234 lb (106.1 kg)     Other studies reviewed: Additional studies/records reviewed today include: summarized above  ASSESSMENT AND PLAN:  Paroxysmal AFib CHA2DS2Vasc is one No symptoms of AFib Doing quite well.  We discussed need to transition to new EP MD, will plan to have him see Dr. Curt Bears (his sister also moving to him from Dr. Rayann Heman)  Disposition: F/u with EP again in a year, sooner if needed.  Current medicines are reviewed at length with the patient today.  The patient did not have any concerns  regarding medicines.  Venetia Night, PA-C 09/20/2022 9:08 AM     [facility] [address] [place] [address] [phone] (office)  [phone] (fax)

## 2022-09-20 ENCOUNTER — Ambulatory Visit: Payer: BLUE CROSS/BLUE SHIELD | Attending: Student | Admitting: Physician Assistant

## 2022-09-20 ENCOUNTER — Encounter: Payer: Self-pay | Admitting: Physician Assistant

## 2022-09-20 VITALS — BP 120/86 | HR 80 | Ht 74.0 in | Wt 232.0 lb

## 2022-09-20 DIAGNOSIS — I480 Paroxysmal atrial fibrillation: Secondary | ICD-10-CM

## 2022-09-20 NOTE — Patient Instructions (Signed)
Medication Instructions:   .Your physician recommends that you continue on your current medications as directed. Please refer to the Current Medication list given to you today.   *If you need a refill on your cardiac medications before your next appointment, please call your pharmacy*   Lab Work:  [place]    If you have labs (blood work) drawn today and your tests are completely normal, you will receive your results only by: [place] (if you have MyChart) OR A paper copy in the mail If you have any lab test that is abnormal or we need to change your treatment, we will call you to review the results.   Testing/Procedures: NONE ORDERED  TODAY    Follow-Up: At [facility], you and your health needs are our priority.  As part of our continuing mission to provide you with exceptional heart care, we have created designated Provider Care Teams.  These Care Teams include your primary Cardiologist (physician) and Advanced Practice Providers (APPs -  Physician Assistants and Nurse Practitioners) who all work together to provide you with the care you need, when you need it.  We recommend signing up for the patient portal called "MyChart".  Sign up information is provided on this After Visit Summary.  MyChart is used to connect with patients for Virtual Visits (Telemedicine).  Patients are able to view lab/test results, encounter notes, upcoming appointments, etc.  Non-urgent messages can be sent to your provider as well.   To learn more about what you can do with MyChart, go to [email or web address].    Your next appointment:   1 year(s)  Provider:   Allegra Lai, MD   Other Instructions

## 2022-12-09 IMAGING — CT CT HEART MORPH/PULM VEIN W/ CM & W/O CA SCORE
2 of 5 series · 9 of 20 positions shown, 11 images · IV contrast (APPLIED)
Comparison: 10/10/2018
COMPARISON: 10/10/2018

Addendum:
EXAM:
OVER-READ INTERPRETATION  CT CHEST

The following report is an over-read performed by radiologist Dr.
Usra Otm [REDACTED] on 05/07/2021. This over-read
does not include interpretation of cardiac or coronary anatomy or
pathology. The coronary CTA interpretation by the cardiologist is
attached.
CLINICAL DATA: Atrial fibrillation scheduled for an ablation.
Cardiac CT/CTA
TECHNIQUE: The patient was scanned on a Siemens Somatom scanner.

[Series 6: pv delay · axial · portal-venous · 0.39mm/px · z∈[-6,+20]mm · 2 of 164 slices shown]
[im 55/164  vessel]
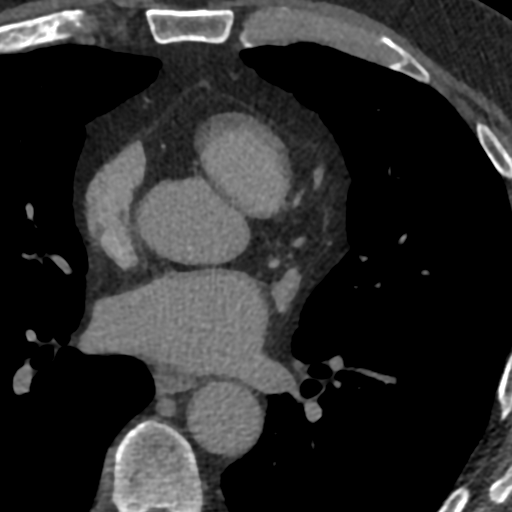
[im 109/164  vessel]
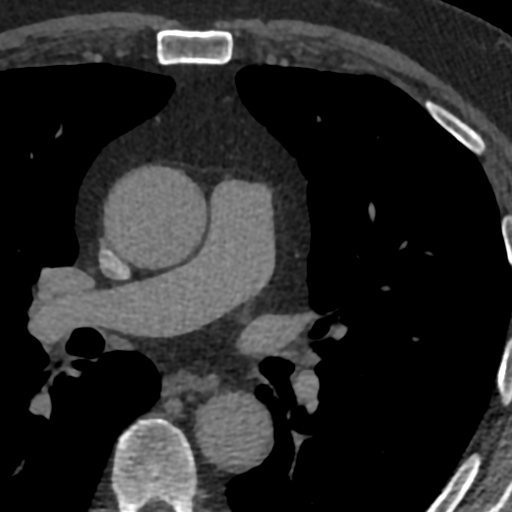

[Series 7: best syst · axial · 0.39mm/px · z∈[-78,+30]mm · 7 of 361 slices shown, 9 images]
[im 46/361  vessel]
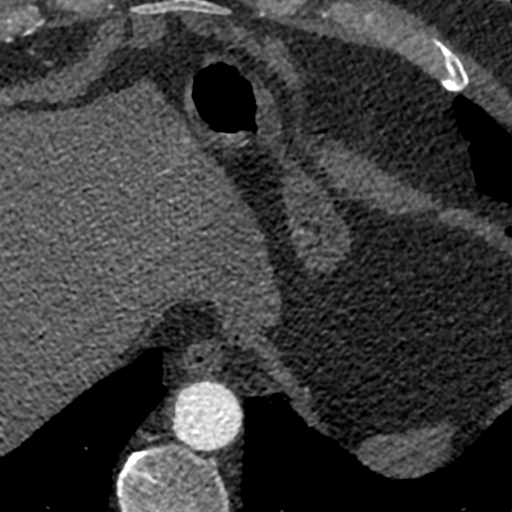
[im 46/361  lung]
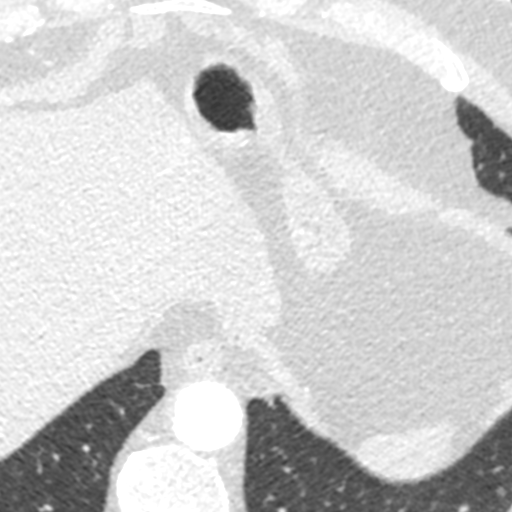
[im 91/361  vessel]
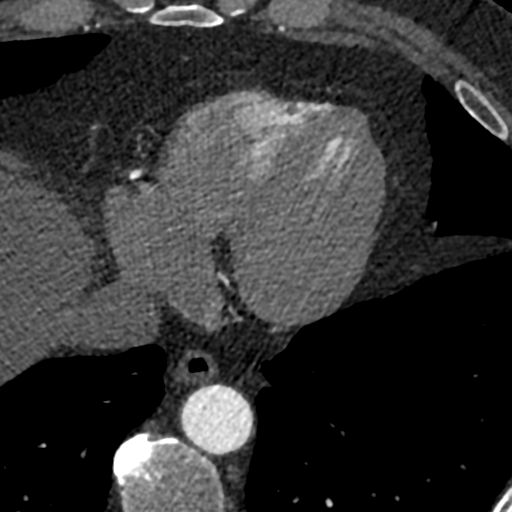
[im 136/361  vessel]
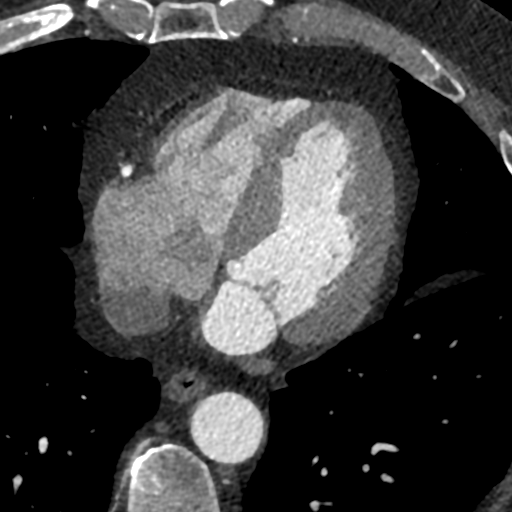
[im 181/361  vessel]
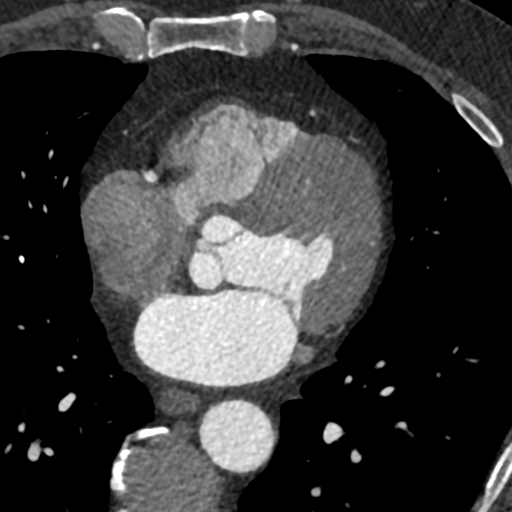
[im 226/361  vessel]
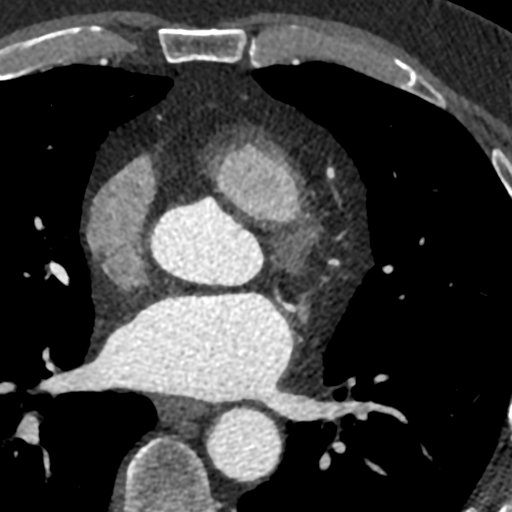
[im 226/361  lung]
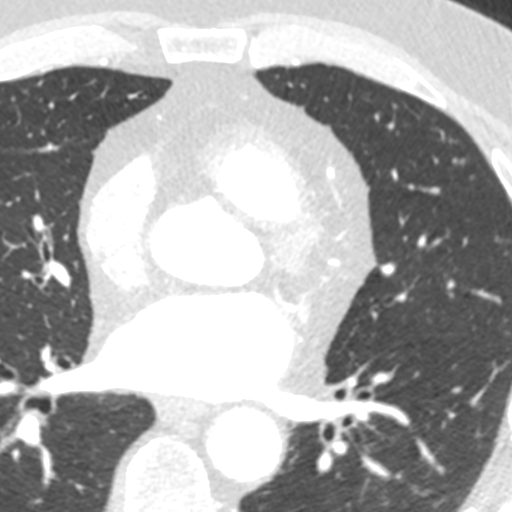
[im 271/361  vessel]
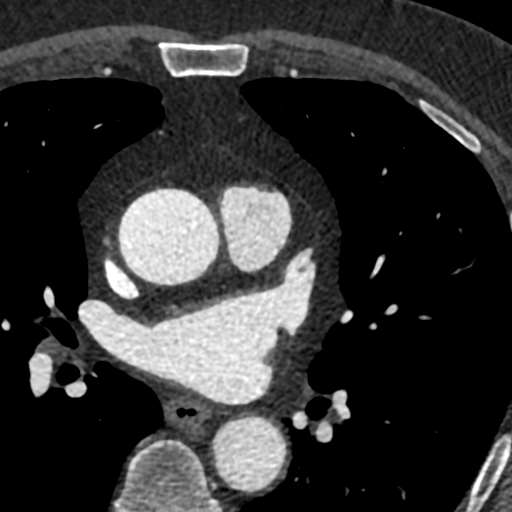
[im 316/361  vessel]
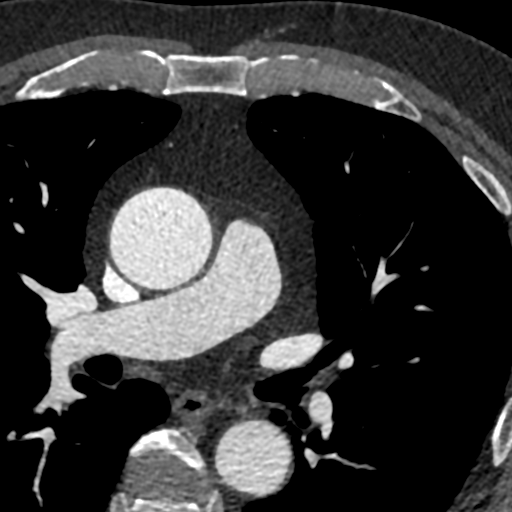

[9 of 20 positions shown; findings below may reference images not displayed]

FINDINGS: Vascular: Heart is normal size.  Aorta normal caliber.

Mediastinum/Nodes: No adenopathy

Lungs/Pleura: Small calcified granuloma posteriorly in the right
lower lobe on image 54. Probable calcified granuloma posteriorly in
the right lower lobe on image 57. These are unchanged since prior
study. No effusions or confluent opacities.

Upper Abdomen: Imaging into the upper abdomen demonstrates no acute
findings.

Musculoskeletal: Chest wall soft tissues are unremarkable. No acute
bony abnormality.
IMPRESSION: No acute or significant extracardiac abnormality.
FINDINGS: A 120 kV prospective scan was triggered in the descending thoracic
aorta at 111 HU's. Gantry rotation speed was 280 msecs and
collimation was .9 mm. No beta blockade and no NTG was given. The 3D
data set was reconstructed in 5% intervals of the 0-90% of the R-R
cycle. Diastolic phases were analyzed on a dedicated work station
using MPR, MIP and VRT modes. The patient received 80 cc of
contrast.

There is normal pulmonary vein drainage into the left atrium (2 on
the right and 2 on the left) with ostial measurements as follows:

RSPV: 20.0 mm x 12.7 mm, area 1.97 cm2

RIPV: 12.4 mm x 11.6 mm, area 1.07 cm2

LSPV: 18.8 mm x 13.9 mm, area 2.07 cm2

LIPV: 17.1 mm x 11.8 mm, area 1.61 cm2

The left atrial appendage is a chicken wing type with a single lobe
and ostial size 19 x 19 mm and length 34 mm. There is no thrombus in
the left atrial appendage.

The esophagus runs in the left atrial midline and is not in the
proximity to any of the pulmonary veins.

Aorta:  Normal caliber.  No dissection or calcifications.

Aortic Valve:  Trileaflet.  No calcifications.

Coronary Arteries: Normal coronary origin. Right dominance. The
study was performed without use of NTG and insufficient for plaque
evaluation.

Calcium score: Coronary calcium score of 0. This was 0 percentile
for age-, race-, and sex-matched controls.
IMPRESSION: 1. There is normal pulmonary vein drainage into the left atrium.

2. The left atrial appendage is a chicken wing type with a single
lobe and ostial size 19 x 19 mm and length 34 mm. There is no
thrombus in the left atrial appendage.

3. The esophagus runs in the left atrial midline and is not in the
proximity to any of the pulmonary veins.

4. Calcium score: Coronary calcium score of 0. This was 0 percentile
for age-, race-, and sex-matched controls.

*** End of Addendum ***
EXAM:
OVER-READ INTERPRETATION  CT CHEST

The following report is an over-read performed by radiologist Dr.
Usra Otm [REDACTED] on 05/07/2021. This over-read
does not include interpretation of cardiac or coronary anatomy or
pathology. The coronary CTA interpretation by the cardiologist is
attached.
FINDINGS: Vascular: Heart is normal size.  Aorta normal caliber.

Mediastinum/Nodes: No adenopathy

Lungs/Pleura: Small calcified granuloma posteriorly in the right
lower lobe on image 54. Probable calcified granuloma posteriorly in
the right lower lobe on image 57. These are unchanged since prior
study. No effusions or confluent opacities.

Upper Abdomen: Imaging into the upper abdomen demonstrates no acute
findings.

Musculoskeletal: Chest wall soft tissues are unremarkable. No acute
bony abnormality.
IMPRESSION: No acute or significant extracardiac abnormality.

## 2023-04-20 ENCOUNTER — Telehealth: Payer: Self-pay | Admitting: Cardiology

## 2023-04-20 NOTE — Telephone Encounter (Signed)
Pt reports 5 afib events in July, each event lasted about 6 hours. He experienced another episode this week, started Sunday and ended yesterday after taking Lopressor. HRs were going into the 130s per his Kardia. Symptoms during recent episode: "SOB, weakness and tired". Pt stable at this time. Aware that I do not think we have an opening w/ Dr. Elberta Fortis w/i next several weeks so will forward to afib to arrange visit in their clinic. Patient verbalized understanding and agreeable to plan.

## 2023-04-20 NOTE — Telephone Encounter (Signed)
Patient c/o Palpitations:  High priority if patient c/o lightheadedness, shortness of breath, or chest pain  How long have you had palpitations/irregular HR/ Afib? Are you having the symptoms now? 4 or 5 times in July and a couple of afib symptoms in Sept   Are you currently experiencing lightheadedness, SOB or CP? Weakness when he has the issues   Do you have a history of afib (atrial fibrillation) or irregular heart rhythm? Yes   Have you checked your BP or HR? (document readings if available): No   Are you experiencing any other symptoms? No

## 2023-04-26 ENCOUNTER — Ambulatory Visit (HOSPITAL_COMMUNITY)
Admission: RE | Admit: 2023-04-26 | Discharge: 2023-04-26 | Disposition: A | Discharge status: Home / Self Care | Payer: Medicare Other | Source: Ambulatory Visit | Attending: Internal Medicine | Admitting: Internal Medicine

## 2023-04-26 VITALS — BP 152/102 | HR 81 | Ht 74.0 in | Wt 233.6 lb

## 2023-04-26 DIAGNOSIS — G4733 Obstructive sleep apnea (adult) (pediatric): Secondary | ICD-10-CM | POA: Insufficient documentation

## 2023-04-26 DIAGNOSIS — I480 Paroxysmal atrial fibrillation: Secondary | ICD-10-CM | POA: Insufficient documentation

## 2023-04-26 DIAGNOSIS — R9431 Abnormal electrocardiogram [ECG] [EKG]: Secondary | ICD-10-CM | POA: Diagnosis not present

## 2023-04-26 NOTE — Progress Notes (Signed)
Primary Care Physician: Romeo Rabon, MD Primary Cardiologist: None Electrophysiologist: None     Referring Physician: Dr. Laverta Baltimore Donlan is a 72 y.o. male with a history of OSA s/p septoplasty, obesity, HLD, hypothyroidism, rosacea, and paroxysmal atrial fibrillation who presents for consultation in the [facility]. He called office noting multiple episodes of Afib in July and September. Patient is not on anticoagulation. He has a CHADS2VASC score of 1.  On follow up 04/26/23, he is currently in NSR. He has noted multiple episodes of Afib and uses Kardiam[employer] device to monitor at home. He specifically noted multiple episodes of Afib in July due to stress from his job. He had an episode last week. He is going to retire in January and strongly believes stress is a trigger for him. He is not on anticoagulation.  Today, he denies symptoms of palpitations, chest pain, shortness of breath, orthopnea, PND, lower extremity edema, dizziness, presyncope, syncope, snoring, daytime somnolence, bleeding, or neurologic sequela. The patient is tolerating medications without difficulties and is otherwise without complaint today.   he has a BMI of Body mass index is 29.99 kg/m.Marland Kitchen Filed Weights   04/26/23 1401  Weight: 106 kg    Current Outpatient Medications  Medication Sig Dispense Refill   acetaminophen (TYLENOL) 500 MG tablet Take 1,000 mg by mouth as needed for moderate pain.     atorvastatin (LIPITOR) 10 MG tablet Take 10 mg by mouth every morning.     doxycycline (VIBRAMYCIN) 100 MG capsule Take 100 mg by mouth See admin instructions. Take 100 mg 3 times weekly, may take 100 mg daily as needed during a rosacea flare     ergocalciferol (VITAMIN D2) 1.25 MG (50000 UT) capsule Take 50,000 Units by mouth every Friday.     GLUCOSAMINE-CHONDROITIN PO Take 1 tablet by mouth 2 (two) times daily.     levothyroxine (SYNTHROID, LEVOTHROID) 100 MCG tablet Take 100  mcg by mouth at bedtime.      meloxicam (MOBIC) 15 MG tablet Take 1 tablet by mouth daily.     metoprolol tartrate (LOPRESSOR) 25 MG tablet TAKE 1 TABLET (25 MG TOTAL) BY MOUTH EVERY 6 (SIX) HOURS AS NEEDED (FOR PALPITATIONS). 60 tablet 3   sildenafil (VIAGRA) 100 MG tablet TAKE 1/2 TO 1 TABLET BY MOUTH AS NEEDED FOR ED     tamsulosin (FLOMAX) 0.4 MG CAPS capsule Take 0.4 mg by mouth daily. Not started medication yet.     traMADol (ULTRAM) 50 MG tablet Take 50 mg by mouth daily.     No current facility-administered medications for this encounter.    Atrial Fibrillation Management history:  Previous antiarrhythmic drugs: flecainide Previous cardioversions:  Previous ablations: 2020, 2022 Anticoagulation history: None currently   ROS- All systems are reviewed and negative except as per the HPI above.  Physical Exam: BP (!) 152/102   Pulse 81   Ht 6\' 2"  (1.88 m)   Wt 106 kg   BMI 29.99 kg/m   GEN: Well nourished, well developed in no acute distress NECK: No JVD; No carotid bruits CARDIAC: Regular rate and rhythm, no murmurs, rubs, gallops RESPIRATORY:  Clear to auscultation without rales, wheezing or rhonchi  ABDOMEN: Soft, non-tender, non-distended EXTREMITIES:  No edema; No deformity   EKG today demonstrates  Vent. rate 81 BPM PR interval 180 ms QRS duration 90 ms QT/QTcB 410/476 ms P-R-T axes -5 29 -42 Normal sinus rhythm T wave abnormality, consider inferior ischemia Abnormal  ECG When compared with ECG of 16-Jun-2021 11:03, PREVIOUS ECG IS PRESENT  Echo 05/04/21 demonstrated  1. Left ventricular ejection fraction, by estimation, is 50 to 55%. The  left ventricle has low normal function. The left ventricle demonstrates  global hypokinesis. Left ventricular diastolic parameters are consistent  with Grade I diastolic dysfunction  (impaired relaxation).   2. Right ventricular systolic function is normal. The right ventricular  size is mildly enlarged.   3. The  mitral valve is normal in structure. Mild mitral valve  regurgitation. No evidence of mitral stenosis.   4. The aortic valve is normal in structure. Aortic valve regurgitation is  not visualized. No aortic stenosis is present.   5. The inferior vena cava is normal in size with greater than 50%  respiratory variability, suggesting right atrial pressure of 3 mmHg.    ASSESSMENT & PLAN CHA2DS2-VASc Score = 1  The patient's score is based upon: CHF History: 0 HTN History: 0 Diabetes History: 0 Stroke History: 0 Vascular Disease History: 0 Age Score: 1 Gender Score: 0       ASSESSMENT AND PLAN: Paroxysmal Atrial Fibrillation (ICD10:  I48.0) The patient's CHA2DS2-VASc score is 1, indicating a 0.6% annual risk of stroke.   History of ablation by Dr. Johney Frame in 2020 and 2022.   He is currently in NSR.   We discussed option of medication as bridge to ablation with options provided in detail. After discussion, he prefers to hold off on medication for now due to potential for adverse effects. He would like to speak with EP regarding possibility of repeat ablation which is reasonable.   Will refer patient to speak with EP regarding ablation.    Lake Bells, PA-C  [facility] [address] [place], [place] [address] [phone]

## 2023-06-17 ENCOUNTER — Ambulatory Visit: Payer: Medicare Other | Admitting: Cardiology

## 2023-06-27 ENCOUNTER — Encounter: Payer: Self-pay | Admitting: Cardiology

## 2023-06-27 ENCOUNTER — Ambulatory Visit: Payer: BLUE CROSS/BLUE SHIELD | Attending: Cardiology | Admitting: Cardiology

## 2023-06-27 VITALS — BP 116/82 | HR 86 | Ht 74.0 in | Wt 235.0 lb

## 2023-06-27 DIAGNOSIS — I480 Paroxysmal atrial fibrillation: Secondary | ICD-10-CM

## 2023-06-27 NOTE — Progress Notes (Signed)
  Electrophysiology Office Note:   Date:  06/27/2023  ID:  Ian Matthews, DOB 1951-03-21, MRN [id number]  Primary Cardiologist: None Electrophysiologist: Tadan Shill Jorja Loa, MD      History of Present Illness:   Ian Matthews is a 72 y.o. male with h/o s/p septoplasty, obesity, hyperlipidemia, atrial fibrillation post ablation in 2020 and 2022 seen today for routine electrophysiology followup.   Since last being seen in our clinic the patient reports doing overall well.  He was having quite a bit of atrial fibrillation this past summer.  Since early September, he has not had any further episodes.  He did decide to retire, and he attributes his lack of symptoms to reduction in stress.  he denies chest pain, palpitations, dyspnea, PND, orthopnea, nausea, vomiting, dizziness, syncope, edema, weight gain, or early satiety.   Review of systems complete and found to be negative unless listed in HPI.   EP Information / Studies Reviewed:    EKG is not ordered today. EKG from 04/26/23 reviewed which showed sinus rhythm        Risk Assessment/Calculations:    CHA2DS2-VASc Score = 1   This indicates a 0.6% annual risk of stroke. The patient's score is based upon: CHF History: 0 HTN History: 0 Diabetes History: 0 Stroke History: 0 Vascular Disease History: 0 Age Score: 1 Gender Score: 0              Physical Exam:   VS:  BP 116/82 (BP Location: Left Arm, Patient Position: Sitting, Cuff Size: Large)   Pulse 86   Ht 6\' 2"  (1.88 m)   Wt 235 lb (106.6 kg)   SpO2 98%   BMI 30.17 kg/m    Wt Readings from Last 3 Encounters:  06/27/23 235 lb (106.6 kg)  04/26/23 233 lb 9.6 oz (106 kg)  09/20/22 232 lb (105.2 kg)     GEN: Well nourished, well developed in no acute distress NECK: No JVD; No carotid bruits CARDIAC: Regular rate and rhythm, no murmurs, rubs, gallops RESPIRATORY:  Clear to auscultation without rales, wheezing or rhonchi  ABDOMEN: Soft, non-tender,  non-distended EXTREMITIES:  No edema; No deformity   ASSESSMENT AND PLAN:    1.  Paroxysmal atrial fibrillation: Post ablation x 2 in 2020 and 2022.  Currently not anticoagulated due to low stroke risk.  As his atrial fibrillation burden has improved, he is happy to continue with his current management.  No changes.  Follow up with EP APP in 6 months  Signed, Wahneta Derocher Jorja Loa, MD

## 2023-06-27 NOTE — Patient Instructions (Signed)
Medication Instructions:  Your physician recommends that you continue on your current medications as directed. Please refer to the Current Medication list given to you today.  *If you need a refill on your cardiac medications before your next appointment, please call your pharmacy*   Lab Work: None ordered   Testing/Procedures: None ordered   Follow-Up: At [facility], you and your health needs are our priority.  As part of our continuing mission to provide you with exceptional heart care, we have created designated Provider Care Teams.  These Care Teams include your primary Cardiologist (physician) and Advanced Practice Providers (APPs -  Physician Assistants and Nurse Practitioners) who all work together to provide you with the care you need, when you need it.  Your next appointment:   6 month(s)  The format for your next appointment:   In Person  Provider:   Renee Ursuy, PA-C    Thank you for choosing [facility]!!   Keith Felten, RN [phone]     

## 2023-09-27 ENCOUNTER — Telehealth: Payer: Self-pay | Admitting: Cardiology

## 2023-09-27 MED ORDER — METOPROLOL TARTRATE 25 MG PO TABS: 25.0000 mg | ORAL_TABLET | Freq: Four times a day (QID) | ORAL | 1 refills | Status: AC | PRN

## 2023-09-27 MED ORDER — METOPROLOL TARTRATE 25 MG PO TABS: 25.0000 mg | ORAL_TABLET | Freq: Four times a day (QID) | ORAL | 10 refills | Status: DC | PRN

## 2023-09-27 NOTE — Telephone Encounter (Signed)
 Patient called in requesting a refill. He mentioned that he sent a MyChart message regarding going into afib a few weeks ago, but hasn't heard back from anyone. Please advise.

## 2023-09-27 NOTE — Telephone Encounter (Signed)
*  STAT* If patient is at the pharmacy, call can be transferred to refill team.   1. Which medications need to be refilled? (please list name of each medication and dose if known)  metoprolol tartrate (LOPRESSOR) 25 MG tablet    2. Which pharmacy/location (including street and city if local pharmacy) is medication to be sent to? [id number] - [place], [place] - [address]    3. Do they need a 30 day or 90 day supply?  30 day supply  Current dose expired.

## 2023-09-27 NOTE — Telephone Encounter (Signed)
 Returned pt call. States he does not use this often at all.  He would like 30 tablets w/ refill.  Aware I will send in corrected Rx. He appreciates the help.

## 2024-01-05 NOTE — Progress Notes (Unsigned)
 Cardiology Office Note Date:  01/05/2024  Patient ID:  Ian Matthews, DOB 01/03/1951, MRN [id number] PCP:  James Mcardle, MD  Electrophysiologist: Dr. Lawana Pray    Chief Complaint: *** 6 mo  History of Present Illness: Ian Matthews is a 73 y.o. male with history of OSA (s/p septoplasty), obesity, HLD, hypothyroidism, AFib, rosacea   He saw Dr. Nunzio Belch 12/30/21, doing well, no changes were made, with low risk score maintained off OAC.  I saw him Feb 2024 He is doing GREAT! He is a working Education officer, environmental, very active, walks his dog daily (about a mile). Hs good exertional capacity. No CP, palpitations or cardiac awareness. No SOB, DOE No near syncope or syncope. No symptoms of AFib No new medical problems or diagnosis', risk score remains one for age Sees his PMD regularly, has labs done there No changes made, planned to transition to Dr. Lawana Pray  He saw the AFib clinic team 04/26/23, 2/2 symptoms and [facility] noted episodes of AFib, suspected increased stress with upcoming retirement was provoking. Discussed a/c, rhythm management options, pt preferred no changes and further d/w EP MD  He saw Dr. Lawana Pray 06/27/23, reported in the last couple months AFib had settled back down. After discussion, planned no changes.  TODAY  *** symptoms *** score   Device information Diagnosed +/- 2015 Feb 2020 Flecainide  (pill in the pocket) > stopped Feb 2021 PVI ablation 10/17/2018 PVI ablation 05/12/2021   Past Medical History:  Diagnosis Date   Calcaneal spur    Hypercholesterolemia    Hypogonadism in male    Hypothyroidism    Neuropathy    Obesity (BMI 35.0-39.9 without comorbidity)    Obstructive sleep apnea    s/p septoplasty   Paroxysmal atrial fibrillation (HCC)    Vitamin D deficiency     Past Surgical History:  Procedure Laterality Date   ATRIAL FIBRILLATION ABLATION N/A 10/17/2018   Procedure: ATRIAL FIBRILLATION ABLATION;  Surgeon: Jolly Needle, MD;  Location: [facility]  INVASIVE CV LAB;  Service: Cardiovascular;  Laterality: N/A;   ATRIAL FIBRILLATION ABLATION N/A 05/12/2021   Procedure: ATRIAL FIBRILLATION ABLATION;  Surgeon: Jolly Needle, MD;  Location: [facility] INVASIVE CV LAB;  Service: Cardiovascular;  Laterality: N/A;   HEMORRHOID SURGERY     SEPTOPLASTY     TONSILLECTOMY      Current Outpatient Medications  Medication Sig Dispense Refill   acetaminophen  (TYLENOL ) 500 MG tablet Take 1,000 mg by mouth as needed for moderate pain.     atorvastatin (LIPITOR) 10 MG tablet Take 10 mg by mouth every morning.     doxycycline (VIBRAMYCIN) 100 MG capsule Take 100 mg by mouth See admin instructions. Take 100 mg 3 times weekly, may take 100 mg daily as needed during a rosacea flare     ergocalciferol (VITAMIN D2) 1.25 MG (50000 UT) capsule Take 50,000 Units by mouth every Friday.     GLUCOSAMINE-CHONDROITIN PO Take 1 tablet by mouth 2 (two) times daily.     levothyroxine  (SYNTHROID , LEVOTHROID) 100 MCG tablet Take 100 mcg by mouth at bedtime.      meloxicam (MOBIC) 15 MG tablet Take 1 tablet by mouth daily.     metoprolol  tartrate (LOPRESSOR ) 25 MG tablet Take 1 tablet (25 mg total) by mouth every 6 (six) hours as needed (for palpitations). 30 tablet 1   sildenafil (VIAGRA) 100 MG tablet TAKE 1/2 TO 1 TABLET BY MOUTH AS NEEDED FOR ED     tamsulosin (FLOMAX) 0.4 MG CAPS capsule Take  0.4 mg by mouth daily. Not started medication yet.     traMADol (ULTRAM) 50 MG tablet Take 50 mg by mouth daily.     No current facility-administered medications for this visit.    Allergies:   Patient has no known allergies.   Social History:  The patient  reports that he has never smoked. He has never used smokeless tobacco. He reports that he does not currently use alcohol. He reports that he does not use drugs.   Family History:  The patient's family history includes Alzheimer's disease in his mother; Atrial fibrillation in his mother and sister; Cancer in his father; Diabetes in  his father.  ROS:  Please see the history of present illness.    All other systems are reviewed and otherwise negative.   PHYSICAL EXAM:  VS:  There were no vitals taken for this visit. BMI: There is no height or weight on file to calculate BMI. Well nourished, well developed, in no acute distress HEENT: normocephalic, atraumatic Neck: no JVD, carotid bruits or masses Cardiac:   RRR; no significant murmurs, no rubs, or gallops Lungs:   CTA b/l, no wheezing, rhonchi or rales Abd: soft, nontender MS: no deformity or  atrophy Ext:  no edema Skin: warm and dry, no rash Neuro:  No gross deficits appreciated Psych: euthymic mood, full affect   EKG:  not done today 12/29/21 personally reviewed, SB 55  05/12/2021: EPS/ablation CONCLUSIONS: 1. Sinus rhythm upon presentation.   2. Intracardiac echo reveals a moderate sized left atrium with four separate pulmonary veins without evidence of pulmonary vein stenosis. 3. Return of electrical activity along the anterior carina of the left pulmonary veins as well as along the posterior portion of the right pulmonary veins at baseline.  4. Successful sequential electrical reisolation of all four pulmonary veins.  Persistent isolation was observed with Adenosine  administration 5. Additional ablation performed at the SVC-RA junction after additional spontaneous firing was observed in this region during isuprel  infusion 6. No early apparent complications.   05/04/21: TTE 1. Left ventricular ejection fraction, by estimation, is 50 to 55%. The  left ventricle has low normal function. The left ventricle demonstrates  global hypokinesis. Left ventricular diastolic parameters are consistent  with Grade I diastolic dysfunction  (impaired relaxation).   2. Right ventricular systolic function is normal. The right ventricular  size is mildly enlarged.   3. The mitral valve is normal in structure. Mild mitral valve  regurgitation. No evidence of mitral  stenosis.   4. The aortic valve is normal in structure. Aortic valve regurgitation is  not visualized. No aortic stenosis is present.   5. The inferior vena cava is normal in size with greater than 50%  respiratory variability, suggesting right atrial pressure of 3 mmHg.   Comparison(s): EF 60%.     10/17/2018; EPS/ablation CONCLUSIONS: 1. Sinus rhythm upon presentation.   2. Intracardiac echo reveals a moderate sized left atrium with four separate pulmonary veins without evidence of pulmonary vein stenosis. 3. Successful electrical isolation and anatomical encircling of all four pulmonary veins with radiofrequency current. 4. No inducible arrhythmias following ablation both on and off of Isuprel  5. No early apparent complications.    Recent Labs: No results found for requested labs within last 365 days.  No results found for requested labs within last 365 days.   CrCl cannot be calculated (Patient's most recent lab result is older than the maximum 21 days allowed.).   Wt Readings from Last 3 Encounters:  06/27/23 235 lb (106.6 kg)  04/26/23 233 lb 9.6 oz (106 kg)  09/20/22 232 lb (105.2 kg)     Other studies reviewed: Additional studies/records reviewed today include: summarized above  ASSESSMENT AND PLAN:  Paroxysmal AFib CHA2DS2Vasc is *** *** No symptoms of AFib *** Doing quite well.   Disposition: F/u with EP in ***, sooner if needed.  Current medicines are reviewed at length with the patient today.  The patient did not have any concerns regarding medicines.  [place], PA-C 01/05/2024 8:57 AM     [facility] [address] [place] [address] [phone] (office)  [phone] (fax)

## 2024-01-06 ENCOUNTER — Ambulatory Visit: Attending: Physician Assistant | Admitting: Physician Assistant

## 2024-01-06 ENCOUNTER — Encounter: Payer: Self-pay | Admitting: Physician Assistant

## 2024-01-06 VITALS — BP 124/80 | HR 73 | Ht 74.0 in | Wt 235.5 lb

## 2024-01-06 DIAGNOSIS — I480 Paroxysmal atrial fibrillation: Secondary | ICD-10-CM | POA: Diagnosis not present

## 2024-01-06 NOTE — Patient Instructions (Addendum)
 Medication Instructions:   Your physician recommends that you continue on your current medications as directed. Please refer to the Current Medication list given to you today.  *If you need a refill on your cardiac medications before your next appointment, please call your pharmacy*  Lab Work:  NONE ORDERED  TODAY   If you have labs (blood work) drawn today and your tests are completely normal, you will receive your results only by: MyChart Message (if you have MyChart) OR A paper copy in the mail If you have any lab test that is abnormal or we need to change your treatment, we will call you to review the results.  Testing/Procedures: NONE ORDERED  TODAY    Follow-Up: At [facility], you and your health needs are our priority.  As part of our continuing mission to provide you with exceptional heart care, our providers are all part of one team.  This team includes your primary Cardiologist (physician) and Advanced Practice Providers or APPs (Physician Assistants and Nurse Practitioners) who all work together to provide you with the care you need, when you need it.  Your next appointment:    6 month(s)   Provider:   You may see Will Cortland Ding, MD or one of the following Advanced Practice Providers on your designated Care Team:   Mertha Abrahams, [place]    We recommend signing up for the patient portal called "MyChart".  Sign up information is provided on this After Visit Summary.  MyChart is used to connect with patients for Virtual Visits (Telemedicine).  Patients are able to view lab/test results, encounter notes, upcoming appointments, etc.  Non-urgent messages can be sent to your provider as well.   To learn more about what you can do with MyChart, go to [email or web address].   Other Instructions

## 2024-10-02 ENCOUNTER — Ambulatory Visit: Admitting: Physician Assistant
